# Patient Record
Sex: Female | Born: 1980 | Race: Black or African American | Hispanic: No | Marital: Single | Smoking: Former smoker
Health system: Southern US, Community
[De-identification: ages and names within clinical notes are randomized; demographics above are authoritative.]

## PROBLEM LIST (undated history)

## (undated) DIAGNOSIS — A599 Trichomoniasis, unspecified: Secondary | ICD-10-CM

## (undated) DIAGNOSIS — R519 Headache, unspecified: Secondary | ICD-10-CM

## (undated) DIAGNOSIS — N390 Urinary tract infection, site not specified: Secondary | ICD-10-CM

## (undated) DIAGNOSIS — Z6830 Body mass index (BMI) 30.0-30.9, adult: Secondary | ICD-10-CM

## (undated) DIAGNOSIS — G8929 Other chronic pain: Secondary | ICD-10-CM

## (undated) DIAGNOSIS — F410 Panic disorder [episodic paroxysmal anxiety] without agoraphobia: Secondary | ICD-10-CM

## (undated) DIAGNOSIS — T7840XA Allergy, unspecified, initial encounter: Secondary | ICD-10-CM

## (undated) DIAGNOSIS — M2141 Flat foot [pes planus] (acquired), right foot: Secondary | ICD-10-CM

## (undated) DIAGNOSIS — R42 Dizziness and giddiness: Secondary | ICD-10-CM

## (undated) DIAGNOSIS — M545 Low back pain: Secondary | ICD-10-CM

## (undated) DIAGNOSIS — Z8669 Personal history of other diseases of the nervous system and sense organs: Secondary | ICD-10-CM

## (undated) DIAGNOSIS — R51 Headache: Secondary | ICD-10-CM

## (undated) DIAGNOSIS — G5603 Carpal tunnel syndrome, bilateral upper limbs: Secondary | ICD-10-CM

## (undated) DIAGNOSIS — G629 Polyneuropathy, unspecified: Secondary | ICD-10-CM

## (undated) DIAGNOSIS — M546 Pain in thoracic spine: Secondary | ICD-10-CM

## (undated) HISTORY — DX: Pain in thoracic spine: M54.6

## (undated) HISTORY — DX: Other chronic pain: G89.29

## (undated) HISTORY — DX: Polyneuropathy, unspecified: G62.9

## (undated) HISTORY — DX: Low back pain: M54.5

## (undated) HISTORY — DX: Body mass index (BMI) 30.0-30.9, adult: Z68.30

## (undated) HISTORY — DX: Allergy, unspecified, initial encounter: T78.40XA

## (undated) HISTORY — PX: TOOTH EXTRACTION: SUR596

## (undated) HISTORY — DX: Headache, unspecified: R51.9

## (undated) HISTORY — DX: Personal history of other diseases of the nervous system and sense organs: Z86.69

## (undated) HISTORY — DX: Trichomoniasis, unspecified: A59.9

## (undated) HISTORY — DX: Flat foot (pes planus) (acquired), right foot: M21.41

## (undated) HISTORY — DX: Dizziness and giddiness: R42

## (undated) HISTORY — DX: Panic disorder (episodic paroxysmal anxiety): F41.0

## (undated) HISTORY — PX: NO PAST SURGERIES: SHX2092

---

## 2001-12-05 ENCOUNTER — Other Ambulatory Visit: Admission: RE | Admit: 2001-12-05 | Discharge: 2001-12-05 | Payer: Self-pay | Admitting: Family Medicine

## 2002-06-09 ENCOUNTER — Inpatient Hospital Stay (HOSPITAL_COMMUNITY): Admission: AD | Admit: 2002-06-09 | Discharge: 2002-06-09 | Payer: Self-pay | Admitting: *Deleted

## 2002-06-12 ENCOUNTER — Inpatient Hospital Stay (HOSPITAL_COMMUNITY): Admission: AD | Admit: 2002-06-12 | Discharge: 2002-06-12 | Payer: Self-pay | Admitting: *Deleted

## 2002-06-14 ENCOUNTER — Inpatient Hospital Stay (HOSPITAL_COMMUNITY): Admission: AD | Admit: 2002-06-14 | Discharge: 2002-06-14 | Payer: Self-pay | Admitting: *Deleted

## 2003-03-01 ENCOUNTER — Emergency Department (HOSPITAL_COMMUNITY): Admission: EM | Admit: 2003-03-01 | Discharge: 2003-03-01 | Payer: Self-pay | Admitting: Emergency Medicine

## 2003-11-02 ENCOUNTER — Inpatient Hospital Stay (HOSPITAL_COMMUNITY): Admission: AD | Admit: 2003-11-02 | Discharge: 2003-11-02 | Payer: Self-pay | Admitting: *Deleted

## 2004-01-23 ENCOUNTER — Emergency Department (HOSPITAL_COMMUNITY): Admission: EM | Admit: 2004-01-23 | Discharge: 2004-01-24 | Payer: Self-pay | Admitting: Emergency Medicine

## 2004-04-27 ENCOUNTER — Inpatient Hospital Stay (HOSPITAL_COMMUNITY): Admission: AD | Admit: 2004-04-27 | Discharge: 2004-04-28 | Payer: Self-pay | Admitting: Gynecology

## 2004-05-25 ENCOUNTER — Inpatient Hospital Stay (HOSPITAL_COMMUNITY): Admission: AD | Admit: 2004-05-25 | Discharge: 2004-05-25 | Payer: Self-pay | Admitting: Family Medicine

## 2004-05-26 ENCOUNTER — Inpatient Hospital Stay (HOSPITAL_COMMUNITY): Admission: AD | Admit: 2004-05-26 | Discharge: 2004-05-26 | Payer: Self-pay | Admitting: *Deleted

## 2004-05-29 ENCOUNTER — Inpatient Hospital Stay (HOSPITAL_COMMUNITY): Admission: AD | Admit: 2004-05-29 | Discharge: 2004-05-29 | Payer: Self-pay | Admitting: *Deleted

## 2004-06-02 ENCOUNTER — Encounter: Admission: RE | Admit: 2004-06-02 | Discharge: 2004-06-02 | Payer: Self-pay | Admitting: Obstetrics and Gynecology

## 2004-10-20 ENCOUNTER — Encounter (INDEPENDENT_AMBULATORY_CARE_PROVIDER_SITE_OTHER): Payer: Self-pay | Admitting: *Deleted

## 2004-10-20 ENCOUNTER — Ambulatory Visit: Payer: Self-pay | Admitting: Obstetrics & Gynecology

## 2005-02-22 ENCOUNTER — Inpatient Hospital Stay (HOSPITAL_COMMUNITY): Admission: AD | Admit: 2005-02-22 | Discharge: 2005-02-22 | Payer: Self-pay | Admitting: Obstetrics & Gynecology

## 2005-03-09 ENCOUNTER — Ambulatory Visit: Payer: Self-pay | Admitting: Obstetrics & Gynecology

## 2006-01-07 ENCOUNTER — Inpatient Hospital Stay (HOSPITAL_COMMUNITY): Admission: AD | Admit: 2006-01-07 | Discharge: 2006-01-07 | Payer: Self-pay | Admitting: Obstetrics and Gynecology

## 2006-01-09 ENCOUNTER — Inpatient Hospital Stay (HOSPITAL_COMMUNITY): Admission: AD | Admit: 2006-01-09 | Discharge: 2006-01-09 | Payer: Self-pay | Admitting: Gynecology

## 2006-03-21 ENCOUNTER — Inpatient Hospital Stay (HOSPITAL_COMMUNITY): Admission: AD | Admit: 2006-03-21 | Discharge: 2006-03-21 | Payer: Self-pay | Admitting: Gynecology

## 2006-03-23 ENCOUNTER — Inpatient Hospital Stay (HOSPITAL_COMMUNITY): Admission: AD | Admit: 2006-03-23 | Discharge: 2006-03-23 | Payer: Self-pay | Admitting: Gynecology

## 2006-03-29 ENCOUNTER — Inpatient Hospital Stay (HOSPITAL_COMMUNITY): Admission: AD | Admit: 2006-03-29 | Discharge: 2006-03-29 | Payer: Self-pay | Admitting: Obstetrics and Gynecology

## 2006-06-15 ENCOUNTER — Ambulatory Visit (HOSPITAL_COMMUNITY): Admission: RE | Admit: 2006-06-15 | Discharge: 2006-06-15 | Payer: Self-pay | Admitting: Obstetrics

## 2006-10-15 ENCOUNTER — Inpatient Hospital Stay (HOSPITAL_COMMUNITY): Admission: AD | Admit: 2006-10-15 | Discharge: 2006-10-15 | Payer: Self-pay | Admitting: Obstetrics

## 2006-10-28 ENCOUNTER — Inpatient Hospital Stay (HOSPITAL_COMMUNITY): Admission: AD | Admit: 2006-10-28 | Discharge: 2006-10-28 | Payer: Self-pay | Admitting: Internal Medicine

## 2006-11-06 ENCOUNTER — Inpatient Hospital Stay (HOSPITAL_COMMUNITY): Admission: AD | Admit: 2006-11-06 | Discharge: 2006-11-06 | Payer: Self-pay | Admitting: Obstetrics

## 2006-11-14 ENCOUNTER — Inpatient Hospital Stay (HOSPITAL_COMMUNITY): Admission: AD | Admit: 2006-11-14 | Discharge: 2006-11-17 | Payer: Self-pay | Admitting: Obstetrics

## 2008-02-15 ENCOUNTER — Inpatient Hospital Stay (HOSPITAL_COMMUNITY): Admission: AD | Admit: 2008-02-15 | Discharge: 2008-02-15 | Payer: Self-pay | Admitting: Obstetrics

## 2008-12-06 ENCOUNTER — Inpatient Hospital Stay (HOSPITAL_COMMUNITY): Admission: AD | Admit: 2008-12-06 | Discharge: 2008-12-06 | Payer: Self-pay | Admitting: Obstetrics and Gynecology

## 2009-04-29 ENCOUNTER — Emergency Department (HOSPITAL_COMMUNITY): Admission: EM | Admit: 2009-04-29 | Discharge: 2009-04-29 | Payer: Self-pay | Admitting: Emergency Medicine

## 2011-01-14 LAB — POCT PREGNANCY, URINE: Preg Test, Ur: NEGATIVE

## 2011-02-19 NOTE — Group Therapy Note (Signed)
NAMETALANA, SLATTEN NO.:  [id number]   MEDICAL RECORD NO.:  [id number]          PATIENT TYPE:  WOC   LOCATION:  [facility]                   FACILITY:  [facility]   PHYSICIAN:  Elsie Lincoln, MD      DATE OF BIRTH:  1981-05-11   DATE OF SERVICE:  10/20/2004                                    CLINIC NOTE   Patient is a 30 year old nulliparous female who complains of irregular  menses.  Usually she has one period a month; however, last month she had 2  periods.  She did take a pregnancy test which was negative.  When queried  about how many irregular periods she has a year it is approximately 1.  Patient was reassured that this can be normal and that further workup is not  needed at this time.  She is complaining of burning after sex, so we will do  cultures today.  She has also a history of Bartholin's cyst that has been  resolved.   REVIEW OF SYMPTOMS:  Positive for vaginal discharge, no other complaints.   PHYSICAL EXAM:  Temperature 97.1, pulse 85, blood pressure 105/66, weight  92.3, height 5 feet 1 inch.  ABDOMEN:  Soft, nontender, nondistended.  GENITALIA:  Tanner V.  VAGINA:  A small amount of discharge present.  Cervix closed, nontender.  UTERUS:  Small, nontender.  ADNEXA:  Nontender, no masses.   ASSESSMENT AND PLAN:  A 30 year old female with one irregular menses this  past year.  Patient reassured.  1.  Pap smear and culture sent.  2.  EPT done.  3.  Return to clinic in 1 year or p.r.n. complaints.      KL/MEDQ  D:  11/16/2004  T:  11/16/2004  Job:  [id number]

## 2011-02-19 NOTE — Group Therapy Note (Signed)
NAME:  Rachel Finley, Rachel Finley NO.:  [id number]   MEDICAL RECORD NO.:  [id number]                   PATIENT TYPE:  OUT   LOCATION:  [facility]                           FACILITY:  [facility]   PHYSICIAN:  Argentina Donovan, MD                     DATE OF BIRTH:  10/08/1980   DATE OF SERVICE:  06/02/2004                                    CLINIC NOTE   HISTORY:  This is a 30 year old nulliparous female who comes by appointment  for a follow-up of Bartholin's cyst.  She states that on May 26, 2004 she  was seen in maternity admissions for an inflamed Bartholin's cyst which was  painful, and at that point she had a tube placed.  She states that this cyst  had been present about 2 years ago but was not causing her any problem  during all that time.  After having sex, she began having pain and states  that she came back to the MAU on August 27 - 5 days later - at which point  they took the tube out as it was getting better.  She does state that it is  less than half the size and not very painful at present.  She is able to  have intercourse without pain.  There has been no purulent drainage.  Her  second concern is with infertility.  She states that with her current  partner, her fiance, she has been trying to get pregnant for a year-and-a-  half.  This person has succeeded in making someone else pregnant.  Previous  to that, she was with the same partner for 3 years, attempting pregnancy,  and that partner also was proven to be fertile.  She states that her periods  are fairly regular with LMP May 25, 2004.  She has about 5 days flow with  medium amount and mild dysmenorrhea at times.  She has no intermenstrual  bleeding.  She has not used any birth control methods in the past.  She  states that her last Pap smear was normal last year and that she has never  had an abnormal Pap, is not due yet for a repeat.   SURGICAL HISTORY:  Noncontributory.   FAMILY HISTORY:   Noncontributory.   PAST MEDICAL HISTORY:  Noncontributory.   SOCIAL HISTORY:  She does work.  She is negative for tobacco, alcohol, or  drugs.  Also negative for sexual or physical abuse.   REVIEW OF SYSTEMS:  Significant for frequent tension-type headaches and she  has had Trichomonas in the past.  Of note, last weekend in MAU she was  treated with Rocephin 1 g IM and was also given Vicodin and Motrin.  Of  note, then her urinalysis was negative.  Also of note, when she was seen  April 27, 2004 in MAU she did have a Trichomonas and a yeast infection.  She  was treated that day for the yeast infection, also with Vantin and  Zithromax, as well as Septra for a presumptive UTI though there was not a  positive urinalysis from that visit.  In January 2005 she was also treated  with Flagyl for Trichomonas vaginitis and was also treated with Diflucan as  well as Rocephin.  However, at that visit she had many yeast, many  Trichomonas, negative GC, negative chlamydia.  Urinalysis was negative  except for 30 of protein at that visit; that was in January 2005.  On  further chart review, it is noted that she did have a positive GC test done  September 2003.  This was treated with Cipro at that time.   PHYSICAL EXAMINATION:  GENERAL:  This is an underweight, 90-pound, 5 feet 1  inch female in no distress.  BREASTS:  Without masses.  ABDOMEN:  Soft, flat, nontender, without obvious masses.  PELVIC:  External genitalia significant for a 2 cm mildly indurated,  slightly tender Bartholin's cyst on the left, no drainage, and diminished in  size from last visit last week.   ASSESSMENT:  1.  Bartholin's cyst, resolving.  2.  Infertility.   PLAN:  Discussed timing for intercourse and she will start using a basal  body thermometer for the next 3 months, and can return for further  evaluation at that time if desired.  She was also advised to take a vitamin  and eat foods high in folic acid.      Sharolyn Douglas, CNM                           Argentina Donovan, MD    DP/MEDQ  D:  06/02/2004  T:  06/03/2004  Job:  [id number]

## 2012-08-23 ENCOUNTER — Encounter (HOSPITAL_COMMUNITY): Payer: Self-pay | Admitting: *Deleted

## 2012-08-23 ENCOUNTER — Inpatient Hospital Stay (HOSPITAL_COMMUNITY)
Admission: AD | Admit: 2012-08-23 | Discharge: 2012-08-23 | Disposition: A | Discharge status: Home / Self Care | Payer: BC Managed Care – PPO | Source: Ambulatory Visit | Attending: Obstetrics and Gynecology | Admitting: Obstetrics and Gynecology

## 2012-08-23 DIAGNOSIS — R1032 Left lower quadrant pain: Secondary | ICD-10-CM | POA: Insufficient documentation

## 2012-08-23 HISTORY — DX: Urinary tract infection, site not specified: N39.0

## 2012-08-23 HISTORY — DX: Headache: R51

## 2012-08-23 HISTORY — DX: Carpal tunnel syndrome, bilateral upper limbs: G56.03

## 2012-08-23 LAB — WET PREP, GENITAL
Trich, Wet Prep: NONE SEEN
Yeast Wet Prep HPF POC: NONE SEEN

## 2012-08-23 MED ORDER — CONCEPT OB 130-92.4-1 MG PO CAPS: 1.0000 | ORAL_CAPSULE | ORAL | Status: DC

## 2012-08-23 MED ORDER — DOXYCYCLINE HYCLATE 100 MG PO CAPS: 100.0000 mg | ORAL_CAPSULE | Freq: Two times a day (BID) | ORAL | Status: DC

## 2012-08-23 NOTE — MAU Note (Signed)
Was in the office on Fri, the 8th.  Had Mirena taken out, is trying to get preg.  Lower abd pain started a couple days later, than had cycle, past couple days pain has returned and been nauseated.

## 2012-08-23 NOTE — MAU Provider Note (Signed)
CC: Abdominal Pain    First Provider Initiated Contact with Patient 08/23/12 1827      HPI AMAIYAH NORDHOFF is a 31 y.o. G1P1001 who presents with history of intermittent crampy lower abdominal pain that began with onset of menses 2 days after IUD was removed 08/11/2012. Cramps are similar to that she had with menses prior to the IUD. The cramps subsided toward the end of her menstrual period, but have recurred especially in left lower quadrant and particularly when she bends over. The discomfort usually lasts about 5 minutes. She denies cramping at present but has a dull discomfort in left lower quadrant. Had normal  BM today which did not affect pain. Denies constipation, diarrhea, vomiting. Vaginal discharge was irritative last week but resolved, no malodor. No new sex partner.  She has been slightly nauseated today. Denies abnormal bleeding.  She is also concerned she could be pregnant since she was sexually active around the time of IUD removal and is attempting pregnancy. LMP 08/13/12.   Past Medical History  Diagnosis Date  . Headache   . Urinary tract infection   . Carpal tunnel syndrome, bilateral     OB History    Grav Para Term Preterm Abortions TAB SAB Ect Mult Living   1 1 1  0 0 0 0 0 0 1     # Outc Date GA Lbr Len/2nd Wgt Sex Del Anes PTL Lv   1 TRM     F SVD EPI No Yes      Past Surgical History  Procedure Date  . No past surgeries     History   Social History  . Marital Status: Single    Spouse Name: N/A    Number of Children: N/A  . Years of Education: N/A   Occupational History  . Not on file.   Social History Main Topics  . Smoking status: Former Games developer  . Smokeless tobacco: Never Used     Comment: age 22  . Alcohol Use: Yes     Comment: socially  . Drug Use: No  . Sexually Active: Yes    Birth Control/ Protection: None   Other Topics Concern  . Not on file   Social History Narrative  . No narrative on file    No current  facility-administered medications on file prior to encounter.   No current outpatient prescriptions on file prior to encounter.    Allergies  Allergen Reactions  . Codeine Rash    Tylenol codeine    ROS Pertinent items in HPI  PHYSICAL EXAM General: Well nourished, well developed female in no acute distress Cardiovascular: Normal rate Respiratory: Normal effort Abdomen: Soft, mildly tender left suprapubic region. No guarding or rebound. Back: No CVAT Extremities: No edema Neurologic: Alert and oriented  Speculum exam: NEFG; vagina with physiologic discharge, no blood; cervix clean Bimanual exam: cervix closed, no CMT; uterus NSSP; slight left adnexal tenderness and fullness  LAB RESULTS Results for orders placed during the hospital encounter of 08/23/12 (from the past 24 hour(s))  POCT PREGNANCY, URINE     Status: Normal   Collection Time   08/23/12  6:20 PM      Component Value Range   Preg Test, Ur NEGATIVE  NEGATIVE  WET PREP, GENITAL     Status: Abnormal   Collection Time   08/23/12  6:40 PM      Component Value Range   Yeast Wet Prep HPF POC NONE SEEN  NONE SEEN   Trich,  Wet Prep NONE SEEN  NONE SEEN   Clue Cells Wet Prep HPF POC FEW (*) NONE SEEN   WBC, Wet Prep HPF POC FEW (*) NONE SEEN     MAU COURSE Declines analgesic. Declines STI testing.  ASSESSMENT 1. Abdominal pain, left lower quadrant     PLAN D/W Dr. Ambrose Mantle Discharge home with Rx Doxyclcline. See AVS for patient education.   Medication List     As of 08/23/2012  7:21 PM    TAKE these medications         CONCEPT OB 130-92.4-1 MG Caps   Take 1 tablet by mouth 1 day or 1 dose.      doxycycline 100 MG capsule   Commonly known as: VIBRAMYCIN   Take 1 capsule (100 mg total) by mouth 2 (two) times daily.      ibuprofen 600 MG tablet   Commonly known as: ADVIL,MOTRIN   Take 600 mg by mouth every 6 (six) hours as needed. headache           Danae Orleans, CNM 08/23/2012 6:44  PM

## 2012-11-24 ENCOUNTER — Ambulatory Visit (INDEPENDENT_AMBULATORY_CARE_PROVIDER_SITE_OTHER): Payer: BC Managed Care – PPO | Admitting: Family Medicine

## 2012-11-24 VITALS — BP 98/70 | HR 80 | Temp 98.2°F | Resp 18 | Ht 61.0 in | Wt 127.0 lb

## 2012-11-24 DIAGNOSIS — R05 Cough: Secondary | ICD-10-CM

## 2012-11-24 MED ORDER — IPRATROPIUM BROMIDE 0.06 % NA SOLN: 2.0000 | Freq: Four times a day (QID) | NASAL | Status: DC

## 2012-11-24 MED ORDER — PROMETHAZINE-DM 6.25-15 MG/5ML PO SYRP: 5.0000 mL | ORAL_SOLUTION | Freq: Every evening | ORAL | Status: DC | PRN

## 2012-11-24 NOTE — Patient Instructions (Signed)
Continue claritin, add atrovent and saline sprays as discussed.  mucinex dm during day for cough, sough syrup at bedtime if needed. Return to the clinic or go to the nearest emergency room if any of your symptoms worsen or new symptoms occur.    Upper Respiratory Infection, Adult An upper respiratory infection (URI) is also known as the common cold. It is often caused by a type of germ (virus). Colds are easily spread (contagious). You can pass it to others by kissing, coughing, sneezing, or drinking out of the same glass. Usually, you get better in 1 or 2 weeks.  HOME CARE   Only take medicine as told by your doctor.  Use a warm mist humidifier or breathe in steam from a hot shower.  Drink enough water and fluids to keep your pee (urine) clear or pale yellow.  Get plenty of rest.  Return to work when your temperature is back to normal or as told by your doctor. You may use a face mask and wash your hands to stop your cold from spreading. GET HELP RIGHT AWAY IF:   After the first few days, you feel you are getting worse.  You have questions about your medicine.  You have chills, shortness of breath, or brown or red spit (mucus).  You have yellow or brown snot (nasal discharge) or pain in the face, especially when you bend forward.  You have a fever, puffy (swollen) neck, pain when you swallow, or white spots in the back of your throat.  You have a bad headache, ear pain, sinus pain, or chest pain.  You have a high-pitched whistling sound when you breathe in and out (wheezing).  You have a lasting cough or cough up blood.  You have sore muscles or a stiff neck. MAKE SURE YOU:   Understand these instructions.  Will watch your condition.  Will get help right away if you are not doing well or get worse. Document Released: 03/08/2008 Document Revised: 12/13/2011 Document Reviewed: 01/25/2011 [facility] Patient Information 2013 [place], [place].

## 2012-11-24 NOTE — Progress Notes (Signed)
Subjective:    Patient ID: Rachel Finley, female    DOB: [birth year], 32 y.o.   MRN: [id number]  HPI Rachel Finley is a 32 y.o. female  "feels horrible" started 4 days ago with sinus pressure, then itchy throat, then eyes watering.  Cough with lying down at night and PND, repetitive sneezing. Watery eyes and congestion worst sx today.  No fevers.  No new bodyaches.   Tx: generic claritin - 1 per day.  Benadryl - helps to sleep.   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  Has 41 yo daughter at home.   Review of Systems  Constitutional: Negative for fever and chills.  HENT: Positive for congestion, rhinorrhea, sneezing and postnasal drip. Negative for sore throat, mouth sores and trouble swallowing.   Respiratory: Positive for cough. Negative for shortness of breath.        Objective:   Physical Exam  Vitals reviewed. Constitutional: She is oriented to person, place, and time. She appears well-developed and well-nourished. No distress.  HENT:  Head: Normocephalic and atraumatic.  Right Ear: Hearing, tympanic membrane, external ear and ear canal normal.  Left Ear: Hearing, tympanic membrane, external ear and ear canal normal.  Nose: Nose normal. Right sinus exhibits no maxillary sinus tenderness and no frontal sinus tenderness. Left sinus exhibits no maxillary sinus tenderness and no frontal sinus tenderness.  Mouth/Throat: Oropharynx is clear and moist. No oropharyngeal exudate.  Eyes: Conjunctivae and EOM are normal. Pupils are equal, round, and reactive to light.  Cardiovascular: Normal rate, regular rhythm, normal heart sounds and intact distal pulses.   No murmur heard. Pulmonary/Chest: Effort normal and breath sounds normal. No respiratory distress. She has no wheezes. She has no rhonchi.  Lymphadenopathy:    She has no cervical adenopathy.  Neurological: She is alert and oriented to person, place, and time.  Skin: Skin is warm and dry. No rash noted.  Psychiatric:  She has a normal mood and affect. Her behavior is normal.       Assessment & Plan:  Rachel Finley is a 32 y.o. female Acute upper respiratory infections of unspecified site - Plan: promethazine-dextromethorphan (PROMETHAZINE-DM) 6.25-15 MG/5ML syrup, ipratropium (ATROVENT) 0.06 % nasal spray  Cough - Plan: promethazine-dextromethorphan (PROMETHAZINE-DM) 6.25-15 MG/5ML syrup, ipratropium (ATROVENT) 0.06 % nasal spray  Uri, with nocturnal cough with insomnia. Sx care as above, sed of phenergan dm, rtc precautions.  Patient Instructions  Continue claritin, add atrovent and saline sprays as discussed.  mucinex dm during day for cough, sough syrup at bedtime if needed. Return to the clinic or go to the nearest emergency room if any of your symptoms worsen or new symptoms occur.    Upper Respiratory Infection, Adult An upper respiratory infection (URI) is also known as the common cold. It is often caused by a type of germ (virus). Colds are easily spread (contagious). You can pass it to others by kissing, coughing, sneezing, or drinking out of the same glass. Usually, you get better in 1 or 2 weeks.  HOME CARE   Only take medicine as told by your doctor.  Use a warm mist humidifier or breathe in steam from a hot shower.  Drink enough water and fluids to keep your pee (urine) clear or pale yellow.  Get plenty of rest.  Return to work when your temperature is back to normal or as told by your doctor. You may use a face mask and wash your hands to stop your cold from  spreading. GET HELP RIGHT AWAY IF:   After the first few days, you feel you are getting worse.  You have questions about your medicine.  You have chills, shortness of breath, or brown or red spit (mucus).  You have yellow or brown snot (nasal discharge) or pain in the face, especially when you bend forward.  You have a fever, puffy (swollen) neck, pain when you swallow, or white spots in the back of your throat.  You  have a bad headache, ear pain, sinus pain, or chest pain.  You have a high-pitched whistling sound when you breathe in and out (wheezing).  You have a lasting cough or cough up blood.  You have sore muscles or a stiff neck. MAKE SURE YOU:   Understand these instructions.  Will watch your condition.  Will get help right away if you are not doing well or get worse. Document Released: 03/08/2008 Document Revised: 12/13/2011 Document Reviewed: 01/25/2011 [facility] Patient Information 2013 [place], [place].

## 2013-04-28 ENCOUNTER — Encounter (HOSPITAL_COMMUNITY): Payer: Self-pay | Admitting: Emergency Medicine

## 2013-04-28 ENCOUNTER — Emergency Department (HOSPITAL_COMMUNITY)
Admission: EM | Admit: 2013-04-28 | Discharge: 2013-04-28 | Disposition: A | Discharge status: Home / Self Care | Payer: BC Managed Care – PPO | Attending: Emergency Medicine | Admitting: Emergency Medicine

## 2013-04-28 ENCOUNTER — Telehealth (HOSPITAL_COMMUNITY): Payer: Self-pay | Admitting: *Deleted

## 2013-04-28 DIAGNOSIS — M549 Dorsalgia, unspecified: Secondary | ICD-10-CM

## 2013-04-28 DIAGNOSIS — Z9109 Other allergy status, other than to drugs and biological substances: Secondary | ICD-10-CM | POA: Insufficient documentation

## 2013-04-28 DIAGNOSIS — M545 Low back pain, unspecified: Secondary | ICD-10-CM | POA: Insufficient documentation

## 2013-04-28 DIAGNOSIS — Z87440 Personal history of urinary (tract) infections: Secondary | ICD-10-CM | POA: Insufficient documentation

## 2013-04-28 DIAGNOSIS — R350 Frequency of micturition: Secondary | ICD-10-CM | POA: Insufficient documentation

## 2013-04-28 DIAGNOSIS — Z87891 Personal history of nicotine dependence: Secondary | ICD-10-CM | POA: Insufficient documentation

## 2013-04-28 DIAGNOSIS — Z3202 Encounter for pregnancy test, result negative: Secondary | ICD-10-CM | POA: Insufficient documentation

## 2013-04-28 DIAGNOSIS — Z8739 Personal history of other diseases of the musculoskeletal system and connective tissue: Secondary | ICD-10-CM | POA: Insufficient documentation

## 2013-04-28 DIAGNOSIS — Z79899 Other long term (current) drug therapy: Secondary | ICD-10-CM | POA: Insufficient documentation

## 2013-04-28 LAB — POCT PREGNANCY, URINE: Preg Test, Ur: NEGATIVE

## 2013-04-28 LAB — URINALYSIS, ROUTINE W REFLEX MICROSCOPIC
Bilirubin Urine: NEGATIVE
Ketones, ur: NEGATIVE mg/dL
Leukocytes, UA: NEGATIVE
Nitrite: NEGATIVE
Urobilinogen, UA: 0.2 mg/dL (ref 0.0–1.0)

## 2013-04-28 LAB — URINE MICROSCOPIC-ADD ON

## 2013-04-28 MED ORDER — ACETAMINOPHEN 500 MG PO TABS
1000.0000 mg | ORAL_TABLET | Freq: Once | ORAL | Status: AC
  Administered 2013-04-28: 1000 mg via ORAL
  Filled 2013-04-28: qty 2

## 2013-04-28 MED ORDER — TRAMADOL HCL 50 MG PO TABS: 50.0000 mg | ORAL_TABLET | Freq: Four times a day (QID) | ORAL | Status: DC | PRN

## 2013-04-28 NOTE — ED Notes (Signed)
Patient reports that she woke up about a week ago and her back was bothering her. The patient denies any injury or noted trauma. The patient denies any further complaints

## 2013-04-28 NOTE — ED Provider Notes (Signed)
CSN: [id number]     Arrival date & time 04/28/13  1759 History  This chart was scribed for Rachel Silk, PA-C working with Suzi Roots, MD by Greggory Stallion, ED scribe. This patient was seen in room WTR7/WTR7 and the patient's care was started at 6:07 PM.   Chief Complaint  Patient presents with  . Back Pain   The history is provided by the patient. No language interpreter was used.    HPI Comments: Rachel Finley is a 32 y.o. female who presents to the Emergency Department complaining of gradual onset, constant achy lower back pain that started one week ago. She states she was laying down when the pain started. Pt states the pain is worsened when she turns. She states if she lays on either of her sides, they go numb. Pt states she has taken generic Advil with no relief. She states she has to urinate more frequently that normal. Pt denies fever, rash, dysuria, urinary urgency, urinary incontinence and bowel incontinence as associated symptoms.   Past Medical History  Diagnosis Date  . Headache   . Urinary tract infection   . Carpal tunnel syndrome, bilateral   . Allergy    Past Surgical History  Procedure Laterality Date  . No past surgeries     Family History  Problem Relation Age of Onset  . Other Neg Hx   . Hypertension Father   . Asthma Daughter    History  Substance Use Topics  . Smoking status: Former Games developer  . Smokeless tobacco: Never Used     Comment: age 62  . Alcohol Use: Yes     Comment: socially   OB History   Grav Para Term Preterm Abortions TAB SAB Ect Mult Living   1 1 1  0 0 0 0 0 0 1     Review of Systems  Constitutional: Negative for fever.  Genitourinary: Positive for frequency. Negative for dysuria and urgency.  Musculoskeletal: Positive for back pain.  Skin: Negative for rash.  All other systems reviewed and are negative.    Allergies  Codeine  Home Medications   Current Outpatient Rx  Name  Route  Sig  Dispense  Refill  .  ipratropium (ATROVENT) 0.06 % nasal spray   Nasal   Place 2 sprays into the nose 4 (four) times daily.   15 mL   1   . Prenat w/o A Vit-FeFum-FePo-FA (CONCEPT OB) 130-92.4-1 MG CAPS   Oral   Take 1 tablet by mouth 1 day or 1 dose.   30 capsule   5   . promethazine-dextromethorphan (PROMETHAZINE-DM) 6.25-15 MG/5ML syrup   Oral   Take 5 mLs by mouth at bedtime as needed for cough.   118 mL   0    BP 121/66  Pulse 85  Temp(Src) 98.9 F (37.2 C) (Oral)  Resp 18  SpO2 99%  Physical Exam  Nursing note and vitals reviewed. Constitutional: She is oriented to person, place, and time. She appears well-developed and well-nourished. No distress.  HENT:  Head: Normocephalic and atraumatic.  Right Ear: External ear normal.  Left Ear: External ear normal.  Nose: Nose normal.  Mouth/Throat: Oropharynx is clear and moist.  Eyes: Conjunctivae are normal.  Neck: Normal range of motion.  Cardiovascular: Normal rate, regular rhythm and normal heart sounds.   Pulmonary/Chest: Effort normal and breath sounds normal. No stridor. No respiratory distress. She has no wheezes. She has no rales.  Abdominal: Soft. She exhibits no distension.  Musculoskeletal: Normal range of motion.  Tenderness to palpation in right lower back. No bony tenderness. Pt walks without antalgic gait. Strength 5/5 in all extremities.   Neurological: She is alert and oriented to person, place, and time. She has normal strength.  Skin: Skin is warm and dry. She is not diaphoretic. No erythema.  Psychiatric: She has a normal mood and affect. Her behavior is normal.    ED Course   Procedures (including critical care time)  DIAGNOSTIC STUDIES: Oxygen Saturation is 99% on RA, normal by my interpretation.    COORDINATION OF CARE: 6:26 PM-Discussed treatment plan which includes UA and Tylenol for pain with pt at bedside and pt agreed to plan.   Labs Reviewed  URINALYSIS, ROUTINE W REFLEX MICROSCOPIC - Abnormal; Notable  for the following:    APPearance CLOUDY (*)    Specific Gravity, Urine 1.031 (*)    Hgb urine dipstick SMALL (*)    All other components within normal limits  URINE MICROSCOPIC-ADD ON - Abnormal; Notable for the following:    Squamous Epithelial / LPF FEW (*)    Bacteria, UA FEW (*)    All other components within normal limits  POCT PREGNANCY, URINE   No results found. 1. Back pain     MDM  Patient with back pain.  No neurological deficits and normal neuro exam.  Patient can walk but states is painful.  No loss of bowel or bladder control.  No concern for cauda equina.  No fever, night sweats, weight loss, h/o cancer, IVDU.  RICE protocol and pain medicine indicated and discussed with patient.      I personally performed the services described in this documentation, which was scribed in my presence. The recorded information has been reviewed and is accurate.    Mora Bellman, PA-C 04/28/13 2018

## 2013-05-01 NOTE — ED Provider Notes (Signed)
Medical screening examination/treatment/procedure(s) were performed by non-physician practitioner and as supervising physician I was immediately available for consultation/collaboration.   Bodhi Moradi E Tameeka Luo, MD 05/01/13 0934 

## 2013-08-09 ENCOUNTER — Ambulatory Visit (INDEPENDENT_AMBULATORY_CARE_PROVIDER_SITE_OTHER): Payer: BC Managed Care – PPO | Admitting: Podiatry

## 2013-08-09 ENCOUNTER — Ambulatory Visit (INDEPENDENT_AMBULATORY_CARE_PROVIDER_SITE_OTHER): Payer: BC Managed Care – PPO

## 2013-08-09 ENCOUNTER — Encounter: Payer: Self-pay | Admitting: Podiatry

## 2013-08-09 VITALS — BP 104/63 | HR 69 | Resp 16 | Ht 61.0 in | Wt 125.0 lb

## 2013-08-09 DIAGNOSIS — M21611 Bunion of right foot: Secondary | ICD-10-CM

## 2013-08-09 DIAGNOSIS — M2010 Hallux valgus (acquired), unspecified foot: Secondary | ICD-10-CM

## 2013-08-09 DIAGNOSIS — M21619 Bunion of unspecified foot: Secondary | ICD-10-CM

## 2013-08-09 NOTE — Progress Notes (Signed)
Subjective:     Patient ID: Rachel Finley, female   DOB: October 30, 1980, 32 y.o.   MRN: [id number]  HPI patient states my bunions are starting to bother me more right over left. States that she is tried to change shoe gear soaks and anti-inflammatories without relief of symptoms and they have shooting pains in them at different intervals   Review of Systems  All other systems reviewed and are negative.       Objective:   Physical Exam  Nursing note and vitals reviewed. Constitutional: She is oriented to person, place, and time.  Cardiovascular: Intact distal pulses.   Musculoskeletal: Normal range of motion.  Neurological: She is oriented to person, place, and time.  Skin: Skin is warm.   patient has hyperostosis medial aspect first metatarsal head right over left with redness and pain when pressed. There is also mild deviation of the hallux with no hammertoe deformity noted     Assessment:     HAV deformity right over left foot    Plan:     H&P and x-ray reviewed. Discussed different treatment options and patient would like to have the bunion corrected. I've recommended one at a time which can be done over Christmas and she'll reappoint middle of December to discuss the surgery consisting of Rachel Finley which I educated her on today

## 2013-08-09 NOTE — Progress Notes (Signed)
  Subjective:    Patient ID: Rachel Finley, female    DOB: Aug 19, 1981, 32 y.o.   MRN: [id number]  HPI Comments: N - aches L - 1st met bilateral (R>L) D - years O - gradual  C - bunions, getting more painful, some swelling and redness A - shoes, walking T - open shoes, massages      Review of Systems  All other systems reviewed and are negative.       Objective:   Physical Exam        Assessment & Plan:

## 2013-09-13 ENCOUNTER — Ambulatory Visit: Payer: BC Managed Care – PPO | Admitting: Podiatry

## 2013-09-20 ENCOUNTER — Ambulatory Visit (INDEPENDENT_AMBULATORY_CARE_PROVIDER_SITE_OTHER): Payer: BC Managed Care – PPO | Admitting: Podiatry

## 2013-09-20 ENCOUNTER — Encounter: Payer: Self-pay | Admitting: Podiatry

## 2013-09-20 VITALS — BP 108/70 | HR 69 | Resp 16

## 2013-09-20 DIAGNOSIS — M2010 Hallux valgus (acquired), unspecified foot: Secondary | ICD-10-CM

## 2013-09-20 NOTE — Patient Instructions (Signed)
Pre-Operative Instructions  Congratulations, you have decided to take an important step to improving your quality of life.  You can be assured that the doctors of [facility] will be with you every step of the way.  1. Plan to be at the surgery center/hospital at least 1 (one) hour prior to your scheduled time unless otherwise directed by the surgical center/hospital staff.  You must have a responsible adult accompany you, remain during the surgery and drive you home.  Make sure you have directions to the surgical center/hospital and know how to get there on time. 2. For hospital based surgery you will need to obtain a history and physical form from your family physician within 1 month prior to the date of surgery- we will give you a form for you primary physician.  3. We make every effort to accommodate the date you request for surgery.  There are however, times where surgery dates or times have to be moved.  We will contact you as soon as possible if a change in schedule is required.   4. No Aspirin/Ibuprofen for one week before surgery.  If you are on aspirin, any non-steroidal anti-inflammatory medications (Mobic, Aleve, Ibuprofen) you should stop taking it 7 days prior to your surgery.  You make take Tylenol  For pain prior to surgery.  5. Medications- If you are taking daily heart and blood pressure medications, seizure, reflux, allergy, asthma, anxiety, pain or diabetes medications, make sure the surgery center/hospital is aware before the day of surgery so they may notify you which medications to take or avoid the day of surgery. 6. No food or drink after midnight the night before surgery unless directed otherwise by surgical center/hospital staff. 7. No alcoholic beverages 24 hours prior to surgery.  No smoking 24 hours prior to or 24 hours after surgery. 8. Wear loose pants or shorts- loose enough to fit over bandages, boots, and casts. 9. No slip on shoes, sneakers are best. 10. Bring  your boot with you to the surgery center/hospital.  Also bring crutches or a walker if your physician has prescribed it for you.  If you do not have this equipment, it will be provided for you after surgery. 11. If you have not been contracted by the surgery center/hospital by the day before your surgery, call to confirm the date and time of your surgery. 12. Leave-time from work may vary depending on the type of surgery you have.  Appropriate arrangements should be made prior to surgery with your employer. 13. Prescriptions will be provided immediately following surgery by your doctor.  Have these filled as soon as possible after surgery and take the medication as directed. 14. Remove nail polish on the operative foot. 15. Wash the night before surgery.  The night before surgery wash the foot and leg well with the antibacterial soap provided and water paying special attention to beneath the toenails and in between the toes.  Rinse thoroughly with water and dry well with a towel.  Perform this wash unless told not to do so by your physician.  Enclosed: 1 Ice pack (please put in freezer the night before surgery)   1 Hibiclens skin cleaner   Pre-op Instructions  If you have any questions regarding the instructions, do not hesitate to call our office.  [place]: [address] [place], [place] [address] [phone]  [place]: [address] [place], [place] [address] [phone]  [place]: [address]  [place], [place] [address] [phone]  Dr. Richard   Tuchman DPM, Dr. Norman Regal DPM Dr. Richard Sikora DPM, Dr. M. Todd Hyatt DPM, Dr. Kathryn Egerton DPM 

## 2013-09-20 NOTE — Progress Notes (Signed)
Subjective:     Patient ID: Rachel Finley, female   DOB: [birth year], 32 y.o.   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 I have recommended [place] bunionectomy and I allowed patient to read consent formline by line concerning correction. Explaining all possible complications and the total recovery Take 6 months to one year for this type of surgery. Patient wants surgery signs consent form and  dispense air fracture walker with all instructions on usage today and was given all preoperative instructions

## 2013-09-24 ENCOUNTER — Encounter: Payer: BC Managed Care – PPO | Admitting: Podiatry

## 2013-10-01 ENCOUNTER — Encounter: Payer: BC Managed Care – PPO | Admitting: Podiatry

## 2014-08-05 ENCOUNTER — Encounter: Payer: Self-pay | Admitting: Podiatry

## 2014-11-07 ENCOUNTER — Ambulatory Visit: Payer: BLUE CROSS/BLUE SHIELD

## 2014-11-28 ENCOUNTER — Ambulatory Visit: Payer: BLUE CROSS/BLUE SHIELD | Admitting: Podiatry

## 2014-12-03 DIAGNOSIS — A5901 Trichomonal vulvovaginitis: Secondary | ICD-10-CM

## 2014-12-03 HISTORY — DX: Trichomonal vulvovaginitis: A59.01

## 2015-03-28 ENCOUNTER — Encounter (HOSPITAL_COMMUNITY): Payer: Self-pay | Admitting: *Deleted

## 2015-03-28 ENCOUNTER — Inpatient Hospital Stay (HOSPITAL_COMMUNITY)
Admission: AD | Admit: 2015-03-28 | Discharge: 2015-03-28 | Disposition: A | Discharge status: Home / Self Care | Payer: BLUE CROSS/BLUE SHIELD | Source: Ambulatory Visit | Attending: Obstetrics and Gynecology | Admitting: Obstetrics and Gynecology

## 2015-03-28 DIAGNOSIS — Z87891 Personal history of nicotine dependence: Secondary | ICD-10-CM | POA: Insufficient documentation

## 2015-03-28 DIAGNOSIS — R102 Pelvic and perineal pain: Secondary | ICD-10-CM | POA: Diagnosis present

## 2015-03-28 DIAGNOSIS — N764 Abscess of vulva: Secondary | ICD-10-CM | POA: Diagnosis not present

## 2015-03-28 HISTORY — DX: Trichomoniasis, unspecified: A59.9

## 2015-03-28 LAB — URINE MICROSCOPIC-ADD ON

## 2015-03-28 LAB — URINALYSIS, ROUTINE W REFLEX MICROSCOPIC
Bilirubin Urine: NEGATIVE
Glucose, UA: NEGATIVE mg/dL
KETONES UR: NEGATIVE mg/dL
LEUKOCYTES UA: NEGATIVE
NITRITE: NEGATIVE
PH: 5.5 (ref 5.0–8.0)
Protein, ur: NEGATIVE mg/dL
SPECIFIC GRAVITY, URINE: 1.015 (ref 1.005–1.030)
UROBILINOGEN UA: 0.2 mg/dL (ref 0.0–1.0)

## 2015-03-28 LAB — POCT PREGNANCY, URINE: Preg Test, Ur: NEGATIVE

## 2015-03-28 NOTE — MAU Provider Note (Signed)
History     CSN: [id number]  Arrival date and time: 03/28/15 2056   None     Chief Complaint  Patient presents with  . Vaginal Pain   HPI This is a 34 y.o. female who presents with c/o swollen area on left labia which is somewhat painful. States it started hurting this morning. Decided to wait it out. Then tonight she looked at the area and noted that it was swollen and saw a bump there.  States has had a Bartholins abscess before (never drained, resolved on its own) but this feels like it is a different and in a different place   Also complains of some urinary frequency but no dysuria or other symptoms. States does not want STD testing, was recently treated for Trichomonas last week.   RN Note:    Expand All Collapse All   Awoke this morning with vaginal pain. Looked at vaginal area tonight and L side is swollen and see a bump there. Urinating more but no pain.           OB History    Gravida Para Term Preterm AB TAB SAB Ectopic Multiple Living   1 1 1  0 0 0 0 0 0 1      Past Medical History  Diagnosis Date  . Headache(784.0)   . Urinary tract infection   . Carpal tunnel syndrome, bilateral   . Allergy     Past Surgical History  Procedure Laterality Date  . No past surgeries    . Tooth extraction      scheduled for Monday Dec 22nd 2014    Family History  Problem Relation Age of Onset  . Other Neg Hx   . Hypertension Father   . Asthma Daughter     History  Substance Use Topics  . Smoking status: Former 11-14-1972  . Smokeless tobacco: Never Used     Comment: age 16  . Alcohol Use: Yes     Comment: socially    Allergies:  Allergies  Allergen Reactions  . Codeine Rash    Tylenol codeine    Prescriptions prior to admission  Medication Sig Dispense Refill Last Dose  . levonorgestrel (MIRENA) 20 MCG/24HR IUD 1 each by Intrauterine route once.   OCT 2013  . traMADol (ULTRAM) 50 MG tablet Take 1 tablet (50 mg total) by mouth every 6 (six) hours as  needed for pain. 15 tablet 0     Review of Systems  Constitutional: Negative for fever, chills and malaise/fatigue.  Gastrointestinal: Negative for nausea, vomiting, abdominal pain, diarrhea and constipation.  Genitourinary: Positive for frequency. Negative for dysuria and urgency.       Lump on left labia Mild tenderness in left labia  Other systems reviewed and patient states she has no other symtoms  Physical Exam   Blood pressure 115/75, pulse 94, temperature 98.2 F (36.8 C), resp. rate 18, height 5\' 1"  (1.549 m), weight 133 lb 3.2 oz (60.419 kg).  Physical Exam  Constitutional: She is oriented to person, place, and time. She appears well-developed and well-nourished. No distress.  HENT:  Head: Normocephalic.  Cardiovascular: Normal rate and regular rhythm.   Respiratory: Effort normal. No respiratory distress.  GI: Soft. She exhibits no distension. There is no tenderness. There is no rebound and no guarding.  Genitourinary: Vagina normal.    No vaginal discharge found.  Right labia appears normal Left labia has a small 2cm area of very mild pink tissue with small amount of  swelling Very small point visible, but unable to express anything from it.  No induration, no fluctuance  Musculoskeletal: Normal range of motion.  Neurological: She is alert and oriented to person, place, and time.  Skin: Skin is warm and dry.  Psychiatric: She has a normal mood and affect.    MAU Course  Procedures  MDM Results for orders placed or performed during the hospital encounter of 03/28/15 (from the past 24 hour(s))  Urinalysis, Routine w reflex microscopic (not at [facility])     Status: Abnormal   Collection Time: 03/28/15  9:10 PM  Result Value Ref Range   Color, Urine YELLOW YELLOW   APPearance CLEAR CLEAR   Specific Gravity, Urine 1.015 1.005 - 1.030   pH 5.5 5.0 - 8.0   Glucose, UA NEGATIVE NEGATIVE mg/dL   Hgb urine dipstick MODERATE (A) NEGATIVE   Bilirubin Urine NEGATIVE  NEGATIVE   Ketones, ur NEGATIVE NEGATIVE mg/dL   Protein, ur NEGATIVE NEGATIVE mg/dL   Urobilinogen, UA 0.2 0.0 - 1.0 mg/dL   Nitrite NEGATIVE NEGATIVE   Leukocytes, UA NEGATIVE NEGATIVE  Urine microscopic-add on     Status: Abnormal   Collection Time: 03/28/15  9:10 PM  Result Value Ref Range   Squamous Epithelial / LPF RARE RARE   WBC, UA 0-2 <3 WBC/hpf   RBC / HPF 3-6 <3 RBC/hpf   Bacteria, UA FEW (A) RARE  Pregnancy, urine POC     Status: None   Collection Time: 03/28/15  9:42 PM  Result Value Ref Range   Preg Test, Ur NEGATIVE NEGATIVE     Assessment and Plan  A:  Left labial abscess, early       Not ready for Incision and drainage  P:  Discussed nature of skin abscesses and that this one is mild and early        Consulted Dr Hinton Rao with my diagnosis, patient exam and my recommendations, she agrees       Recommend warm soaks or compresses. Instructed to call office if this enlarges or becomes more painful       Recommended against application of vaseline       Follow up in office as needed for other gynecological problems  [facility] 03/28/2015, 9:20 PM

## 2015-03-28 NOTE — Discharge Instructions (Signed)
Abscess °An abscess (boil or furuncle) is an infected area on or under the skin. This area is filled with yellowish-white fluid (pus) and other material (debris). °HOME CARE  °· Only take medicines as told by your doctor. °· If you were given antibiotic medicine, take it as directed. Finish the medicine even if you start to feel better. °· If gauze is used, follow your doctor's directions for changing the gauze. °· To avoid spreading the infection: °¨ Keep your abscess covered with a bandage. °¨ Wash your hands well. °¨ Do not share personal care items, towels, or whirlpools with others. °¨ Avoid skin contact with others. °· Keep your skin and clothes clean around the abscess. °· Keep all doctor visits as told. °GET HELP RIGHT AWAY IF:  °· You have more pain, puffiness (swelling), or redness in the wound site. °· You have more fluid or blood coming from the wound site. °· You have muscle aches, chills, or you feel sick. °· You have a fever. °MAKE SURE YOU:  °· Understand these instructions. °· Will watch your condition. °· Will get help right away if you are not doing well or get worse. °Document Released: 03/08/2008 Document Revised: 03/21/2012 Document Reviewed: 12/03/2011 °ExitCare® Patient Information ©2015 [employer]. This information is not intended to replace advice given to you by your health care provider. Make sure you discuss any questions you have with your health care provider. ° °

## 2015-03-28 NOTE — MAU Note (Signed)
Awoke this morning with vaginal pain. Looked at vaginal area tonight and L side is swollen and see a bump there. Urinating more but no pain.

## 2015-03-28 NOTE — Progress Notes (Signed)
Only visual exam externally to visualize sore area

## 2016-03-05 ENCOUNTER — Encounter (HOSPITAL_COMMUNITY): Payer: Self-pay | Admitting: *Deleted

## 2016-03-05 ENCOUNTER — Emergency Department (HOSPITAL_COMMUNITY)
Admission: EM | Admit: 2016-03-05 | Discharge: 2016-03-05 | Disposition: A | Discharge status: Home / Self Care | Payer: Managed Care, Other (non HMO) | Attending: Dermatology | Admitting: Dermatology

## 2016-03-05 DIAGNOSIS — R51 Headache: Secondary | ICD-10-CM | POA: Diagnosis present

## 2016-03-05 DIAGNOSIS — Z87891 Personal history of nicotine dependence: Secondary | ICD-10-CM | POA: Diagnosis not present

## 2016-03-05 DIAGNOSIS — Z5321 Procedure and treatment not carried out due to patient leaving prior to being seen by health care provider: Secondary | ICD-10-CM | POA: Insufficient documentation

## 2016-03-05 DIAGNOSIS — Z79899 Other long term (current) drug therapy: Secondary | ICD-10-CM | POA: Diagnosis not present

## 2016-03-05 NOTE — ED Notes (Signed)
PT reports coughing, sneezing, nasal congestion, headache ("feels like a migraine"), and chest congestion for several days. Pt says she felt lightheaded at work on Thursday, comes and goes since then.

## 2016-03-05 NOTE — ED Notes (Signed)
Per registration clerk, pt left without being seen

## 2016-10-23 ENCOUNTER — Encounter (HOSPITAL_COMMUNITY): Payer: Self-pay | Admitting: Emergency Medicine

## 2016-10-23 DIAGNOSIS — Y999 Unspecified external cause status: Secondary | ICD-10-CM | POA: Diagnosis not present

## 2016-10-23 DIAGNOSIS — S3992XA Unspecified injury of lower back, initial encounter: Secondary | ICD-10-CM | POA: Insufficient documentation

## 2016-10-23 DIAGNOSIS — Z79899 Other long term (current) drug therapy: Secondary | ICD-10-CM | POA: Diagnosis not present

## 2016-10-23 DIAGNOSIS — M546 Pain in thoracic spine: Secondary | ICD-10-CM | POA: Insufficient documentation

## 2016-10-23 DIAGNOSIS — Y92410 Unspecified street and highway as the place of occurrence of the external cause: Secondary | ICD-10-CM | POA: Insufficient documentation

## 2016-10-23 DIAGNOSIS — M542 Cervicalgia: Secondary | ICD-10-CM | POA: Insufficient documentation

## 2016-10-23 DIAGNOSIS — Y939 Activity, unspecified: Secondary | ICD-10-CM | POA: Diagnosis not present

## 2016-10-23 DIAGNOSIS — Z87891 Personal history of nicotine dependence: Secondary | ICD-10-CM | POA: Insufficient documentation
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
## 2016-10-24 ENCOUNTER — Emergency Department (HOSPITAL_COMMUNITY)
Admission: EM | Admit: 2016-10-24 | Discharge: 2016-10-24 | Disposition: A | Discharge status: Home / Self Care | Payer: Managed Care, Other (non HMO) | Attending: Emergency Medicine | Admitting: Emergency Medicine

## 2016-10-24 ENCOUNTER — Emergency Department (HOSPITAL_COMMUNITY): Payer: Managed Care, Other (non HMO)

## 2016-10-24 DIAGNOSIS — S3992XA Unspecified injury of lower back, initial encounter: Secondary | ICD-10-CM | POA: Diagnosis not present

## 2016-10-24 LAB — POC URINE PREG, ED: Preg Test, Ur: NEGATIVE

## 2016-10-24 MED ORDER — LIDOCAINE 5 % EX PTCH
1.0000 | MEDICATED_PATCH | CUTANEOUS | 0 refills | Status: DC
Start: 2016-10-24 — End: 2021-02-26

## 2016-10-24 MED ORDER — METHOCARBAMOL 500 MG PO TABS: 500.0000 mg | ORAL_TABLET | Freq: Two times a day (BID) | ORAL | 0 refills | Status: DC

## 2016-10-24 MED ORDER — IBUPROFEN 800 MG PO TABS: 800.0000 mg | ORAL_TABLET | Freq: Three times a day (TID) | ORAL | 0 refills | Status: DC

## 2016-10-24 MED ORDER — IBUPROFEN 800 MG PO TABS
800.0000 mg | ORAL_TABLET | Freq: Once | ORAL | Status: AC
Start: 2016-10-24 — End: 2016-10-24
  Administered 2016-10-24: 800 mg via ORAL
  Filled 2016-10-24: qty 1

## 2016-10-24 NOTE — Discharge Instructions (Signed)
Expect your soreness to increase over the next 2-3 days. Take it easy, but do not lay around too much as this may make any stiffness worse.  Antiinflammatory medications: Take 500 mg of naproxen every 12 hours or 800 mg of ibuprofen every 8 hours for the next 3 days. Take these medications with food to avoid upset stomach. Choose only one of these medications, do not take them together.  Muscle relaxer: Robaxin is a muscle relaxer and may help loosen stiff muscles. Do not take the Robaxin while driving or performing other dangerous activities.   Lidocaine patches: These are available via either prescription or over-the-counter. The over-the-counter option may be more economical one and are likely just as effective. There are multiple over-the-counter brands, such as Salonpas.  Exercises: Be sure to perform the attached exercises starting with three times a week and working up to performing them daily. This is an essential part of preventing long term problems. Follow up with a primary care provider for any future management of these complaints.  You could have also sustained a head injury. It does not appear to be serious at this time.

## 2016-10-24 NOTE — ED Notes (Signed)
Patient transported to X-ray 
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
## 2017-11-18 ENCOUNTER — Ambulatory Visit: Payer: Managed Care, Other (non HMO) | Admitting: Podiatry

## 2018-03-16 ENCOUNTER — Ambulatory Visit: Payer: Managed Care, Other (non HMO) | Admitting: Podiatry

## 2018-03-20 ENCOUNTER — Ambulatory Visit: Payer: Managed Care, Other (non HMO) | Admitting: Podiatry
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
## 2018-04-17 ENCOUNTER — Ambulatory Visit: Payer: Managed Care, Other (non HMO) | Admitting: Podiatry

## 2018-06-02 ENCOUNTER — Encounter: Payer: Self-pay | Admitting: Sports Medicine

## 2018-06-02 ENCOUNTER — Ambulatory Visit: Payer: Managed Care, Other (non HMO) | Admitting: Sports Medicine

## 2018-06-02 ENCOUNTER — Ambulatory Visit (INDEPENDENT_AMBULATORY_CARE_PROVIDER_SITE_OTHER): Payer: Managed Care, Other (non HMO)

## 2018-06-02 DIAGNOSIS — M21619 Bunion of unspecified foot: Secondary | ICD-10-CM | POA: Diagnosis not present

## 2018-06-02 DIAGNOSIS — M2141 Flat foot [pes planus] (acquired), right foot: Secondary | ICD-10-CM | POA: Diagnosis not present

## 2018-06-02 DIAGNOSIS — M2142 Flat foot [pes planus] (acquired), left foot: Secondary | ICD-10-CM

## 2018-06-02 DIAGNOSIS — M25473 Effusion, unspecified ankle: Secondary | ICD-10-CM

## 2018-06-02 DIAGNOSIS — M79671 Pain in right foot: Secondary | ICD-10-CM

## 2018-06-02 DIAGNOSIS — M79672 Pain in left foot: Secondary | ICD-10-CM

## 2018-06-02 MED ORDER — MELOXICAM 15 MG PO TABS: 15.0000 mg | ORAL_TABLET | Freq: Every day | ORAL | 0 refills | Status: DC

## 2018-06-02 NOTE — Progress Notes (Signed)
Subjective: Rachel Finley is a 37 y.o. female patient who presents to office for evaluation of bilateral bunion pain for years that is mostly discomfort and foot pain along the bottoms and arch and pain to ankles with swelling that has worsened over the last 6 months.  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  Patient admits to a family history of bunions and denies any history of arthritides.  Patient reports that her bunions and foot pain is causing difficulty with shoe fitting.  Reports that she had to give away all of her high heels.  Patient denies any other pedal complaints.   Review of Systems  Cardiovascular: Positive for leg swelling.  Musculoskeletal: Positive for joint pain.  All other systems reviewed and are negative.   There are no active problems to display for this patient.   Current Outpatient Medications on File Prior to Visit  Medication Sig Dispense Refill  . ibuprofen (ADVIL,MOTRIN) 600 MG tablet Take 600 mg by mouth every 6 (six) hours as needed.    Marland Kitchen ibuprofen (ADVIL,MOTRIN) 800 MG tablet Take 1 tablet (800 mg total) by mouth 3 (three) times daily. 21 tablet 0  . levonorgestrel (MIRENA) 20 MCG/24HR IUD 1 each by Intrauterine route once.    . lidocaine (LIDODERM) 5 % Place 1 patch onto the skin daily. Remove & Discard patch within 12 hours or as directed by MD 30 patch 0  . methocarbamol (ROBAXIN) 500 MG tablet Take 1 tablet (500 mg total) by mouth 2 (two) times daily. 20 tablet 0  . traMADol (ULTRAM) 50 MG tablet Take 1 tablet (50 mg total) by mouth every 6 (six) hours as needed for pain. 15 tablet 0   No current facility-administered medications on file prior to visit.     Allergies  Allergen Reactions  . Latex Itching  . Codeine Rash    Tylenol codeine    Objective:  General: Alert and oriented x3 in no  acute distress  Dermatology: No open lesions bilateral lower extremities, no webspace macerations, no ecchymosis bilateral, all nails x 10 are well manicured.  Vascular: Dorsalis Pedis and Posterior Tibial pedal pulses 2/4, Capillary Fill Time 3 seconds, (+) pedal hair growth bilateral, trace edema bilateral lower extremities however at ankles there is considerable amount of soft tissue fullness likely benign and due to body habitus, Temperature gradient within normal limits.  Neurology: Gross sensation intact via light touch bilateral.  Musculoskeletal: Mild tenderness with palpation bunion deformity, no limitation or crepitus with range of motion, deformity reducible, tracking not trackbound, there is no 1st ray hypermobility noted bilateral. Midtarsal, Subtalar joint, and ankle joint range of motion is within normal limits with subjective pain diffusely on plantar surfaces of both feet. On weightbearing exam, there is medial arch collapse bilateral with rearfoot valgus, forefoot slight abduction with HAV deformity supported on ground with no second toe crossover deformity noted.    Xrays  Right/Left Foot and ankle   Impression: Intermetatarsal angle above normal limits supportive of bunion only there is also midtarsal breech supportive of pes planus.  No acute ankle pathology.  No other acute findings.       Assessment and Plan: Problem List Items Addressed This Visit    None    Visit Diagnoses    Foot pain, bilateral    -  Primary   Relevant Orders   DG Foot Complete Right   DG Foot Complete Left   Bunion       Pes planus of both feet       Ankle swelling, unspecified laterality           -Complete examination performed -Xrays reviewed -Discussed treatement options; discussed HAV deformity with pes planus and likely ankle impingement and possible dependent swelling;conservative and  Surgical management; risks, benefits, alternatives discussed. All patient's questions  answered. -Recommend patient to get compression garments from elastic therapy -Rx meloxicam to take as instructed -Recommend continue with good supportive shoes and will check orthotic coverage for custom insoles if not covered offered to patient to come back at a later time to pick up a set of power step insoles of which we will hold for her when she is ready if custom insoles are not covered -Patient to return to office as needed or sooner if condition worsens.  Asencion Islam, DPM

## 2018-06-06 ENCOUNTER — Other Ambulatory Visit: Payer: Self-pay | Admitting: Sports Medicine

## 2018-06-06 DIAGNOSIS — M79672 Pain in left foot: Principal | ICD-10-CM

## 2018-06-06 DIAGNOSIS — M21619 Bunion of unspecified foot: Secondary | ICD-10-CM

## 2018-06-06 DIAGNOSIS — M2141 Flat foot [pes planus] (acquired), right foot: Secondary | ICD-10-CM

## 2018-06-06 DIAGNOSIS — M79671 Pain in right foot: Secondary | ICD-10-CM

## 2018-06-06 DIAGNOSIS — M2142 Flat foot [pes planus] (acquired), left foot: Secondary | ICD-10-CM

## 2018-06-06 DIAGNOSIS — M25473 Effusion, unspecified ankle: Secondary | ICD-10-CM

## 2018-07-31 ENCOUNTER — Telehealth: Payer: Self-pay | Admitting: *Deleted

## 2018-08-01 NOTE — Telephone Encounter (Signed)
Pt asked if there was other therapy than surgery. I reviewed LOV 06/02/2018 and Dr. Marylene Land had offered orthotics if covered, and had offered PowerStep if pt would like. I informed pt of Dr. Wynema Birch alternatives and pt asked if she could pick up in the office and I told her she could. Pt states they don't fit all shoe. I told pt she could get a 3/4 style for casual and dress shoes.

## 2018-08-01 NOTE — Telephone Encounter (Signed)
Pt states she was seen about 1 month ago, and has questions.

## 2018-08-01 NOTE — Telephone Encounter (Signed)
Great, Thank you!

## 2018-08-11 ENCOUNTER — Other Ambulatory Visit: Payer: Self-pay

## 2018-08-11 ENCOUNTER — Emergency Department (HOSPITAL_COMMUNITY)
Admission: EM | Admit: 2018-08-11 | Discharge: 2018-08-11 | Disposition: A | Discharge status: Home / Self Care | Payer: Managed Care, Other (non HMO) | Attending: Emergency Medicine | Admitting: Emergency Medicine

## 2018-08-11 ENCOUNTER — Encounter (HOSPITAL_COMMUNITY): Payer: Self-pay | Admitting: Obstetrics and Gynecology

## 2018-08-11 DIAGNOSIS — M25512 Pain in left shoulder: Secondary | ICD-10-CM | POA: Insufficient documentation

## 2018-08-11 DIAGNOSIS — M542 Cervicalgia: Secondary | ICD-10-CM | POA: Insufficient documentation

## 2018-08-11 DIAGNOSIS — Z79899 Other long term (current) drug therapy: Secondary | ICD-10-CM | POA: Diagnosis not present

## 2018-08-11 MED ORDER — METHOCARBAMOL 500 MG PO TABS: 500.0000 mg | ORAL_TABLET | Freq: Two times a day (BID) | ORAL | 0 refills | Status: AC

## 2018-08-11 NOTE — ED Notes (Signed)
Discharge instructions reviewed with patient. Patient verbalizes understanding. VSS.   
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
## 2018-08-29 ENCOUNTER — Encounter: Payer: Self-pay | Admitting: *Deleted

## 2018-08-29 ENCOUNTER — Other Ambulatory Visit: Payer: Self-pay | Admitting: *Deleted

## 2018-08-30 ENCOUNTER — Encounter: Payer: Self-pay | Admitting: Neurology

## 2018-08-30 ENCOUNTER — Ambulatory Visit (INDEPENDENT_AMBULATORY_CARE_PROVIDER_SITE_OTHER): Payer: Managed Care, Other (non HMO) | Admitting: Neurology

## 2018-08-30 DIAGNOSIS — M545 Low back pain, unspecified: Secondary | ICD-10-CM

## 2018-08-30 DIAGNOSIS — G8929 Other chronic pain: Secondary | ICD-10-CM | POA: Diagnosis not present

## 2018-08-30 HISTORY — DX: Low back pain, unspecified: M54.50

## 2018-08-30 MED ORDER — NORTRIPTYLINE HCL 10 MG PO CAPS: ORAL_CAPSULE | ORAL | 3 refills | Status: DC

## 2018-08-30 MED ORDER — MELOXICAM 7.5 MG PO TABS: 7.5000 mg | ORAL_TABLET | Freq: Every day | ORAL | 1 refills | Status: DC
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
## 2018-08-30 NOTE — Patient Instructions (Signed)
We will start nortriptyline for the back pain and get back on Mobic 7.5 mg daily with food.   Pamelor (nortriptyline) is an antidepressant medication that has many uses that may include headache, whiplash injuries, or for peripheral neuropathy pain. Side effects may include drowsiness, dry mouth, blurred vision, or constipation. As with any antidepressant medication, worsening depression may occur. If you had any significant side effects, please call our office. The full effects of this medication may take 7-10 days after starting the drug, or going up on the dose.

## 2018-10-31 ENCOUNTER — Encounter (HOSPITAL_COMMUNITY): Payer: Self-pay | Admitting: Obstetrics and Gynecology

## 2018-10-31 ENCOUNTER — Emergency Department (HOSPITAL_COMMUNITY)
Admission: EM | Admit: 2018-10-31 | Discharge: 2018-10-31 | Disposition: A | Discharge status: Home / Self Care | Payer: Managed Care, Other (non HMO) | Attending: Emergency Medicine | Admitting: Emergency Medicine

## 2018-10-31 ENCOUNTER — Other Ambulatory Visit: Payer: Self-pay

## 2018-10-31 DIAGNOSIS — Z91040 Latex allergy status: Secondary | ICD-10-CM | POA: Insufficient documentation

## 2018-10-31 DIAGNOSIS — Z87891 Personal history of nicotine dependence: Secondary | ICD-10-CM | POA: Insufficient documentation

## 2018-10-31 DIAGNOSIS — Z79899 Other long term (current) drug therapy: Secondary | ICD-10-CM | POA: Diagnosis not present

## 2018-10-31 DIAGNOSIS — M549 Dorsalgia, unspecified: Secondary | ICD-10-CM | POA: Diagnosis not present

## 2018-10-31 LAB — PREGNANCY, URINE: Preg Test, Ur: NEGATIVE

## 2018-10-31 MED ORDER — METHOCARBAMOL 500 MG PO TABS: 500.0000 mg | ORAL_TABLET | Freq: Two times a day (BID) | ORAL | 0 refills | Status: DC

## 2018-10-31 MED ORDER — NAPROXEN 375 MG PO TABS: 375.0000 mg | ORAL_TABLET | Freq: Two times a day (BID) | ORAL | 0 refills | Status: DC
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
## 2019-03-26 ENCOUNTER — Telehealth: Payer: Self-pay | Admitting: Neurology

## 2019-03-26 NOTE — Telephone Encounter (Signed)
Noted  

## 2019-03-26 NOTE — Telephone Encounter (Signed)
Due to current COVID 19 pandemic, our office is severely reducing in office visits until further notice, in order to minimize the risk to our patients and healthcare providers.   I called patient regarding her 6/23 appt. LVM requesting pt call back. Office contact info included.

## 2019-03-26 NOTE — Telephone Encounter (Signed)
Patient returned my call and stated that she feels that having a follow-up at this time would be pointless because she has not been taking the medicine prescribed by Dr. Jannifer Franklin. Patient states that she forgets to take her medicine and only takes it if she notices that she swells. She requested that the appt be cancelled for now and she will call back to reschedule.

## 2019-03-27 ENCOUNTER — Ambulatory Visit: Payer: Managed Care, Other (non HMO) | Admitting: Neurology

## 2019-08-03 ENCOUNTER — Other Ambulatory Visit: Payer: Self-pay | Admitting: Sports Medicine

## 2019-08-03 ENCOUNTER — Ambulatory Visit: Payer: Managed Care, Other (non HMO) | Admitting: Sports Medicine

## 2019-08-03 ENCOUNTER — Encounter: Payer: Self-pay | Admitting: Sports Medicine

## 2019-08-03 ENCOUNTER — Other Ambulatory Visit: Payer: Self-pay

## 2019-08-03 ENCOUNTER — Ambulatory Visit (INDEPENDENT_AMBULATORY_CARE_PROVIDER_SITE_OTHER): Payer: Managed Care, Other (non HMO)

## 2019-08-03 DIAGNOSIS — M25473 Effusion, unspecified ankle: Secondary | ICD-10-CM

## 2019-08-03 DIAGNOSIS — M79671 Pain in right foot: Secondary | ICD-10-CM

## 2019-08-03 DIAGNOSIS — M779 Enthesopathy, unspecified: Secondary | ICD-10-CM

## 2019-08-03 DIAGNOSIS — M722 Plantar fascial fibromatosis: Secondary | ICD-10-CM

## 2019-08-03 DIAGNOSIS — M79672 Pain in left foot: Secondary | ICD-10-CM

## 2019-08-03 DIAGNOSIS — M2142 Flat foot [pes planus] (acquired), left foot: Secondary | ICD-10-CM

## 2019-08-03 DIAGNOSIS — M2141 Flat foot [pes planus] (acquired), right foot: Secondary | ICD-10-CM | POA: Diagnosis not present

## 2019-08-03 MED ORDER — DICLOFENAC SODIUM 75 MG PO TBEC: 75.0000 mg | DELAYED_RELEASE_TABLET | Freq: Two times a day (BID) | ORAL | 0 refills | Status: DC

## 2019-08-03 MED ORDER — TRIAMCINOLONE ACETONIDE 10 MG/ML IJ SUSP
10.0000 mg | Freq: Once | INTRAMUSCULAR | Status: AC
Start: 2019-08-03 — End: 2019-08-03
  Administered 2019-08-03: 12:00:00 10 mg

## 2019-08-03 NOTE — Progress Notes (Signed)
Subjective: Rachel Finley is a 38 y.o. female patient who presents to office for evaluation of bilateral foot pain. Reports that pain over the last 1 month has gotten worse especially at work when on feet. 9/10 sharp pain constant in nature.  Patient reports that last year she tried the over-the-counter insoles topical pain cream and rub and Epson salt soaks with no relief.  Patient reports that the most comfortable shoes are her crocs.  Patient also reports that she has been using compression sleeve for swelling which helps but does not help her foot pain.  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  Patient denies any new injury or any other pedal complaints at this time.   Patient Active Problem List   Diagnosis Date Noted  . Chronic low back pain 08/30/2018    Current Outpatient Medications on File Prior to Visit  Medication Sig Dispense Refill  . citalopram (CELEXA) 10 MG tablet citalopram 10 mg tablet  TAKE ONE TABLET BY MOUTH EVERY DAY    . levonorgestrel (MIRENA) 20 MCG/24HR IUD 1 each by Intrauterine route once.    . lidocaine (LIDODERM) 5 % Place 1 patch onto the skin daily. Remove & Discard patch within 12 hours or as directed by MD (Patient not taking: Reported on 08/30/2018) 30 patch 0  . medroxyPROGESTERone (DEPO-PROVERA) 150 MG/ML injection medroxyprogesterone 150 mg/mL intramuscular suspension  Inject 1 mL every 3 months by intramuscular route.    . methocarbamol (ROBAXIN) 500 MG tablet Take 1 tablet (500 mg total) by mouth 2 (two) times daily. 14 tablet 0  . naproxen (NAPROSYN) 375 MG tablet Take 1 tablet (375 mg total) by mouth 2 (two) times daily. 20 tablet 0  . nortriptyline (PAMELOR) 10 MG capsule Take one capsule at night for one week, then take 2 capsules at night for one week, then take 3 capsules at night 90 capsule 3  . SUMAtriptan (IMITREX) 100 MG tablet sumatriptan 100 mg tablet  Take 1 (one) Tablet at onset of severe headache     . traMADol (ULTRAM) 50 MG tablet Take 1 tablet (50 mg total) by mouth every 6 (six) hours as needed for pain. 15 tablet 0   No current facility-administered medications on file prior to visit.     Allergies  Allergen Reactions  . Latex Itching  . Codeine Rash    Tylenol codeine    Objective:  General: Alert and oriented x3 in no acute distress  Dermatology: No open lesions bilateral lower extremities, no webspace macerations, no ecchymosis bilateral, all nails x 10 are well manicured.  Vascular: Dorsalis Pedis and Posterior Tibial pedal pulses 2/4, Capillary Fill Time 3 seconds, (+) pedal hair growth bilateral, trace edema bilateral lower extremities however at ankles there is a little more swelling on her left ankle as compared to the right, temperature gradient within normal limits.  Neurology: Michaell Cowing sensation intact via light touch bilateral.  Musculoskeletal: Mild tenderness at plantar fascial insertion left greater than right with ankle swelling.  There is also pain along the posterior tibial tendon course bilateral left greater than right.  Unchanged bunion and mild hammertoe deformity.   Xrays  Right/Left Foot and ankle   Impression: Intermetatarsal angle above normal limits supportive of bunion only there is also midtarsal breech supportive of pes planus.  No acute ankle pathology.  No other acute findings.  No changes from prior.       Assessment and Plan: Problem List Items  Addressed This Visit    None    Visit Diagnoses    Plantar fasciitis, left    -  Primary   Tendinitis       Pes planus of both feet       Foot pain, bilateral       Ankle swelling, unspecified laterality           -Complete examination performed -Xrays reviewed -Discussed treatment options for tendinitis at posterior tibial tendon due to flatfeet as well as plantar fasciitis on left -After oral consent and aseptic prep, injected a mixture containing 1 ml of 2%  plain lidocaine, 1 ml 0.5%  plain marcaine, 0.5 ml of kenalog 10 and 0.5 ml of dexamethasone phosphate into left heel at plantar fascial glabrous junction without complication. Post-injection care discussed with patient.  -Applied plantar fascial taping bilateral and advised patient if this works well may benefit from custom insoles -Rx diclofenac to take as instructed -Recommend continue with good supportive shoes  -May continue with soaking with Epson salt topical pain creams gentle stretching and icing as needed -Patient to return to office 1 month or sooner if condition worsens.  Landis Martins, DPM

## 2019-09-07 ENCOUNTER — Other Ambulatory Visit: Payer: Self-pay

## 2019-09-07 ENCOUNTER — Ambulatory Visit: Payer: Managed Care, Other (non HMO) | Admitting: Sports Medicine

## 2019-09-07 ENCOUNTER — Encounter: Payer: Self-pay | Admitting: Sports Medicine

## 2019-09-07 DIAGNOSIS — M79671 Pain in right foot: Secondary | ICD-10-CM | POA: Diagnosis not present

## 2019-09-07 DIAGNOSIS — M779 Enthesopathy, unspecified: Secondary | ICD-10-CM

## 2019-09-07 DIAGNOSIS — M2141 Flat foot [pes planus] (acquired), right foot: Secondary | ICD-10-CM | POA: Diagnosis not present

## 2019-09-07 DIAGNOSIS — M79672 Pain in left foot: Secondary | ICD-10-CM

## 2019-09-07 DIAGNOSIS — M2142 Flat foot [pes planus] (acquired), left foot: Secondary | ICD-10-CM

## 2019-09-07 DIAGNOSIS — M722 Plantar fascial fibromatosis: Secondary | ICD-10-CM

## 2019-09-07 NOTE — Patient Instructions (Signed)
Voltaren or Aspercreme pain patch

## 2019-09-07 NOTE — Progress Notes (Signed)
Subjective: Rachel Finley is a 38 y.o. female patient who returns office for follow-up evaluation of bilateral pain.  Patient reports that her left foot feels better after injection and reports that she only has pain when she tries to wear shoe with a little bit of hyper heel to it but otherwise doing much better.  Patient reports that she did not take the diclofenac because she was afraid of possible side effects and wanted to further discuss it with me before she starts to take it.  Patient reports that she still has swelling but otherwise the pain seems to be a little better.  Patient denies any new pedal complaints at this time.   Patient Active Problem List   Diagnosis Date Noted  . Chronic low back pain 08/30/2018    Current Outpatient Medications on File Prior to Visit  Medication Sig Dispense Refill  . citalopram (CELEXA) 10 MG tablet citalopram 10 mg tablet  TAKE ONE TABLET BY MOUTH EVERY DAY    . diclofenac (VOLTAREN) 75 MG EC tablet Take 1 tablet (75 mg total) by mouth 2 (two) times daily. 30 tablet 0  . levonorgestrel (MIRENA) 20 MCG/24HR IUD 1 each by Intrauterine route once.    . lidocaine (LIDODERM) 5 % Place 1 patch onto the skin daily. Remove & Discard patch within 12 hours or as directed by MD (Patient not taking: Reported on 08/30/2018) 30 patch 0  . medroxyPROGESTERone (DEPO-PROVERA) 150 MG/ML injection medroxyprogesterone 150 mg/mL intramuscular suspension  Inject 1 mL every 3 months by intramuscular route.    . methocarbamol (ROBAXIN) 500 MG tablet Take 1 tablet (500 mg total) by mouth 2 (two) times daily. 14 tablet 0  . naproxen (NAPROSYN) 375 MG tablet Take 1 tablet (375 mg total) by mouth 2 (two) times daily. 20 tablet 0  . nortriptyline (PAMELOR) 10 MG capsule Take one capsule at night for one week, then take 2 capsules at night for one week, then take 3 capsules at night 90 capsule 3  . SUMAtriptan (IMITREX) 100 MG tablet sumatriptan 100 mg tablet  Take 1 (one)  Tablet at onset of severe headache    . traMADol (ULTRAM) 50 MG tablet Take 1 tablet (50 mg total) by mouth every 6 (six) hours as needed for pain. 15 tablet 0   No current facility-administered medications on file prior to visit.     Allergies  Allergen Reactions  . Latex Itching  . Codeine Rash    Tylenol codeine    Objective:  General: Alert and oriented x3 in no acute distress  Dermatology: No open lesions bilateral lower extremities, no webspace macerations, no ecchymosis bilateral, all nails x 10 are well manicured.  Vascular: Dorsalis Pedis and Posterior Tibial pedal pulses 2/4, Capillary Fill Time 3 seconds, (+) pedal hair growth bilateral, trace edema.  Neurology: Michaell Cowing sensation intact via light touch bilateral.  Musculoskeletal: Mild tenderness at plantar fascial insertion left greater than right with ankle swelling.  There is also pain along the posterior tibial tendon course bilateral left greater than right.  Midtarsal breech supportive of pes planus deformity bilateral.  + bunion and mild hammertoe deformity.       Assessment and Plan: Problem List Items Addressed This Visit    None    Visit Diagnoses    Plantar fasciitis, left    -  Primary   Tendinitis       Pes planus of both feet       Foot pain, bilateral           -  Complete examination performed -Re- discussed treatment options for tendinitis and plantar fasciitis secondary to pes planus -Patient refused repeat injection at this visit -Advised patient to pick up diclofenac to take as instructed -Recommend continue with good supportive shoes and over-the-counter insoles as tolerated advised patient to take shoe liner out of shoe to give space so that way her shoe does not fit too tight -May continue with soaking with Epson salt topical pain creams gentle stretching and icing as needed -Encourage patient to use compression garments for edema control -Patient to return to office as needed or sooner if  condition worsens.  Landis Martins, DPM

## 2020-04-03 ENCOUNTER — Encounter: Payer: Self-pay | Admitting: Sports Medicine

## 2020-04-03 ENCOUNTER — Ambulatory Visit (INDEPENDENT_AMBULATORY_CARE_PROVIDER_SITE_OTHER): Payer: Managed Care, Other (non HMO)

## 2020-04-03 ENCOUNTER — Other Ambulatory Visit: Payer: Self-pay

## 2020-04-03 ENCOUNTER — Ambulatory Visit (INDEPENDENT_AMBULATORY_CARE_PROVIDER_SITE_OTHER): Payer: Managed Care, Other (non HMO) | Admitting: Sports Medicine

## 2020-04-03 ENCOUNTER — Other Ambulatory Visit: Payer: Self-pay | Admitting: Sports Medicine

## 2020-04-03 DIAGNOSIS — M722 Plantar fascial fibromatosis: Secondary | ICD-10-CM | POA: Diagnosis not present

## 2020-04-03 DIAGNOSIS — M79672 Pain in left foot: Secondary | ICD-10-CM

## 2020-04-03 DIAGNOSIS — M21619 Bunion of unspecified foot: Secondary | ICD-10-CM | POA: Diagnosis not present

## 2020-04-03 DIAGNOSIS — M79671 Pain in right foot: Secondary | ICD-10-CM

## 2020-04-03 DIAGNOSIS — M069 Rheumatoid arthritis, unspecified: Secondary | ICD-10-CM | POA: Diagnosis not present

## 2020-04-03 NOTE — Progress Notes (Signed)
Subjective: Rachel Finley is a 39 y.o. female returns to office for follow up evaluation of bilateral foot pain.  Patient reports that pain is worse on left greater than right over the last 3 to 4 months constant in nature slowly getting worse reports that there is also been swelling left greater than right got a little worse after she came back from [place] reports that there is random sharp shooting pains over her bunion and pain worse with shoes to this area as well as pain to the arches and heels reports that previous over-the-counter orthotics did not help Epson salt and elevation has not helped and reports that her feet hurt so bad that she cannot deal with it anymore.   Patient Active Problem List   Diagnosis Date Noted  . Chronic low back pain 08/30/2018    Current Outpatient Medications on File Prior to Visit  Medication Sig Dispense Refill  . citalopram (CELEXA) 10 MG tablet citalopram 10 mg tablet  TAKE ONE TABLET BY MOUTH EVERY DAY    . diclofenac (VOLTAREN) 75 MG EC tablet Take 1 tablet (75 mg total) by mouth 2 (two) times daily. 30 tablet 0  . levonorgestrel (MIRENA) 20 MCG/24HR IUD 1 each by Intrauterine route once.    . lidocaine (LIDODERM) 5 % Place 1 patch onto the skin daily. Remove & Discard patch within 12 hours or as directed by MD (Patient not taking: Reported on 08/30/2018) 30 patch 0  . medroxyPROGESTERone (DEPO-PROVERA) 150 MG/ML injection medroxyprogesterone 150 mg/mL intramuscular suspension  Inject 1 mL every 3 months by intramuscular route.    . methocarbamol (ROBAXIN) 500 MG tablet Take 1 tablet (500 mg total) by mouth 2 (two) times daily. 14 tablet 0  . naproxen (NAPROSYN) 375 MG tablet Take 1 tablet (375 mg total) by mouth 2 (two) times daily. 20 tablet 0  . nortriptyline (PAMELOR) 10 MG capsule Take one capsule at night for one week, then take 2 capsules at night for one week, then take 3 capsules at night 90 capsule 3  . SUMAtriptan (IMITREX) 100 MG tablet  sumatriptan 100 mg tablet  Take 1 (one) Tablet at onset of severe headache    . traMADol (ULTRAM) 50 MG tablet Take 1 tablet (50 mg total) by mouth every 6 (six) hours as needed for pain. 15 tablet 0   No current facility-administered medications on file prior to visit.    Allergies  Allergen Reactions  . Latex Itching  . Codeine Rash    Tylenol codeine    Objective:   General:  Alert and oriented x 3, in no acute distress  Dermatology: Skin is warm, dry, and supple bilateral. Nails are within normal limits. There is no lower extremity erythema, no eccymosis, no open lesions present bilateral.   Vascular: Dorsalis Pedis and Posterior Tibial pedal pulses are 2/4 bilateral. + hair growth noted bilateral. Capillary Fill Time is 3 seconds in all digits. No varicosities, trace edema left greater than right bilateral lower extremities.  Neurological: Sensation grossly intact to light touch bilateral.  Musculoskeletal: There is pain with palpation along the medial arch and medial calcaneal tubercle at the insertion of the plantar fascia consistent with plantar fasciitis left greater than right.  There is also mild tenderness noted to palpation of bilateral bunions left greater than right with subjective sharp shooting pains occasionally through these areas.  Pes planus foot type.  There is decreased Ankle joint range of motion bilateral. All other jointsrange of motion  within normal limits bilateral. Strength 5/5 bilateral.   Assessment and Plan: Problem List Items Addressed This Visit    None    Visit Diagnoses    Rheumatoid arthritis involving both feet, unspecified whether rheumatoid factor present ([place])    -  Primary   Relevant Orders   Uric acid   Sedimentation rate   C-reactive protein   Rheumatoid factor   ANA, IFA Comprehensive Panel   HLA-B27 antigen   CBC with Differential/Platelet   Plantar fasciitis, bilateral       Bunion       Foot pain, bilateral           -Complete examination performed.  -X-rays reviewed consistent with midtarsal breech and pes planus deformity, minimal calcaneal inferior heel spur, mild soft tissue swelling, mild increase in intermetatarsal angle left greater than right consistent with bunion deformity no other acute findings. -Discussed with patient in detail the condition of plantar fasciitis flareup and bunion pain/capsulitis, how this occurs related to the foot type of the patient and general treatment options. - Patient opted for another injection today; After oral consent and aseptic prep, injected a mixture containing 1 ml of 1%plain lidocaine, 1 ml 0.5% plain marcaine, 0.5 ml of kenalog 10 and 0.5 ml of dexmethasone phosphate to left and right heel at area of most pain/trigger point injection. -Applied plantar fascial strapping bilateral with instructions to keep in place for 5 days if works then will benefit from custom insoles  -Continue with stretching, icing, good supportive shoes daily  -Discussed long term care and reocurrence; will closely monitor; if fails to improve will consider other treatment modalities.  -Rx Arthritic panel family history of gout  -Patient to return to office after blood work for follow up or sooner if problems or questions arise.  Landis Martins, DPM

## 2020-04-18 ENCOUNTER — Other Ambulatory Visit: Payer: Self-pay | Admitting: Sports Medicine

## 2020-04-18 DIAGNOSIS — M722 Plantar fascial fibromatosis: Secondary | ICD-10-CM

## 2021-02-26 ENCOUNTER — Other Ambulatory Visit: Payer: Self-pay

## 2021-02-26 DIAGNOSIS — G629 Polyneuropathy, unspecified: Secondary | ICD-10-CM | POA: Insufficient documentation

## 2021-02-26 DIAGNOSIS — M2142 Flat foot [pes planus] (acquired), left foot: Secondary | ICD-10-CM | POA: Insufficient documentation

## 2021-02-26 DIAGNOSIS — M2141 Flat foot [pes planus] (acquired), right foot: Secondary | ICD-10-CM | POA: Insufficient documentation

## 2021-02-26 DIAGNOSIS — N390 Urinary tract infection, site not specified: Secondary | ICD-10-CM | POA: Insufficient documentation

## 2021-02-26 DIAGNOSIS — G5603 Carpal tunnel syndrome, bilateral upper limbs: Secondary | ICD-10-CM | POA: Insufficient documentation

## 2021-02-26 DIAGNOSIS — M546 Pain in thoracic spine: Secondary | ICD-10-CM | POA: Insufficient documentation

## 2021-02-26 DIAGNOSIS — F411 Generalized anxiety disorder: Secondary | ICD-10-CM | POA: Insufficient documentation

## 2021-02-26 DIAGNOSIS — N751 Abscess of Bartholin's gland: Secondary | ICD-10-CM

## 2021-02-26 DIAGNOSIS — R42 Dizziness and giddiness: Secondary | ICD-10-CM | POA: Insufficient documentation

## 2021-02-26 DIAGNOSIS — R519 Headache, unspecified: Secondary | ICD-10-CM | POA: Insufficient documentation

## 2021-02-26 DIAGNOSIS — A599 Trichomoniasis, unspecified: Secondary | ICD-10-CM | POA: Insufficient documentation

## 2021-02-26 DIAGNOSIS — T7840XA Allergy, unspecified, initial encounter: Secondary | ICD-10-CM | POA: Insufficient documentation

## 2021-02-26 DIAGNOSIS — Z6830 Body mass index (BMI) 30.0-30.9, adult: Secondary | ICD-10-CM | POA: Insufficient documentation

## 2021-02-26 DIAGNOSIS — Z8669 Personal history of other diseases of the nervous system and sense organs: Secondary | ICD-10-CM | POA: Insufficient documentation

## 2021-02-26 DIAGNOSIS — F410 Panic disorder [episodic paroxysmal anxiety] without agoraphobia: Secondary | ICD-10-CM | POA: Insufficient documentation

## 2021-02-26 HISTORY — DX: Abscess of Bartholin's gland: N75.1

## 2021-03-20 ENCOUNTER — Ambulatory Visit: Payer: Managed Care, Other (non HMO) | Admitting: Cardiology

## 2021-03-20 ENCOUNTER — Other Ambulatory Visit: Payer: Self-pay

## 2021-03-20 ENCOUNTER — Encounter: Payer: Self-pay | Admitting: Cardiology

## 2021-03-20 DIAGNOSIS — I441 Atrioventricular block, second degree: Secondary | ICD-10-CM

## 2021-03-20 DIAGNOSIS — R55 Syncope and collapse: Secondary | ICD-10-CM

## 2021-03-20 HISTORY — DX: Atrioventricular block, second degree: I44.1

## 2021-03-20 HISTORY — DX: Syncope and collapse: R55

## 2021-03-20 NOTE — Patient Instructions (Signed)
Medication Instructions:  No medication changes. *If you need a refill on your cardiac medications before your next appointment, please call your pharmacy*   Lab Work: None ordered If you have labs (blood work) drawn today and your tests are completely normal, you will receive your results only by: MyChart Message (if you have MyChart) OR A paper copy in the mail If you have any lab test that is abnormal or we need to change your treatment, we will call you to review the results.   Testing/Procedures: Your physician has requested that you have an echocardiogram. Echocardiography is a painless test that uses sound waves to create images of your heart. It provides your doctor with information about the size and shape of your heart and how well your heart's chambers and valves are working. This procedure takes approximately one hour. There are no restrictions for this procedure.    Follow-Up: At [facility], you and your health needs are our priority.  As part of our continuing mission to provide you with exceptional heart care, we have created designated Provider Care Teams.  These Care Teams include your primary Cardiologist (physician) and Advanced Practice Providers (APPs -  Physician Assistants and Nurse Practitioners) who all work together to provide you with the care you need, when you need it.  We recommend signing up for the patient portal called "MyChart".  Sign up information is provided on this After Visit Summary.  MyChart is used to connect with patients for Virtual Visits (Telemedicine).  Patients are able to view lab/test results, encounter notes, upcoming appointments, etc.  Non-urgent messages can be sent to your provider as well.   To learn more about what you can do with MyChart, go to [email or web address].    Your next appointment:   3 month(s)  The format for your next appointment:   In Person  Provider:   Rajan Revankar, MD   Other  Instructions Echocardiogram An echocardiogram is a test that uses sound waves (ultrasound) to produce images of the heart. Images from an echocardiogram can provide important information about: Heart size and shape. The size and thickness and movement of your heart's walls. Heart muscle function and strength. Heart valve function or if you have stenosis. Stenosis is when the heart valves are too narrow. If blood is flowing backward through the heart valves (regurgitation). A tumor or infectious growth around the heart valves. Areas of heart muscle that are not working well because of poor blood flow or injury from a heart attack. Aneurysm detection. An aneurysm is a weak or damaged part of an artery wall. The wall bulges out from the normal force of blood pumping through the body. Tell a health care provider about: Any allergies you have. All medicines you are taking, including vitamins, herbs, eye drops, creams, and over-the-counter medicines. Any blood disorders you have. Any surgeries you have had. Any medical conditions you have. Whether you are pregnant or may be pregnant. What are the risks? Generally, this is a safe test. However, problems may occur, including an allergic reaction to dye (contrast) that may be used during the test. What happens before the test? No specific preparation is needed. You may eat and drink normally. What happens during the test? You will take off your clothes from the waist up and put on a hospital gown. Electrodes or electrocardiogram (ECG)patches may be placed on your chest. The electrodes or patches are then connected to a device that monitors your heart rate and rhythm. You will   lie down on a table for an ultrasound exam. A gel will be applied to your chest to help sound waves pass through your skin. A handheld device, called a transducer, will be pressed against your chest and moved over your heart. The transducer produces sound waves that travel to  your heart and bounce back (or "echo" back) to the transducer. These sound waves will be captured in real-time and changed into images of your heart that can be viewed on a video monitor. The images will be recorded on a computer and reviewed by your health care provider. You may be asked to change positions or hold your breath for a short time. This makes it easier to get different views or better views of your heart. In some cases, you may receive contrast through an IV in one of your veins. This can improve the quality of the pictures from your heart. The procedure may vary among health care providers and hospitals.   What can I expect after the test? You may return to your normal, everyday life, including diet, activities, and medicines, unless your health care provider tells you not to do that. Follow these instructions at home: It is up to you to get the results of your test. Ask your health care provider, or the department that is doing the test, when your results will be ready. Keep all follow-up visits. This is important. Summary An echocardiogram is a test that uses sound waves (ultrasound) to produce images of the heart. Images from an echocardiogram can provide important information about the size and shape of your heart, heart muscle function, heart valve function, and other possible heart problems. You do not need to do anything to prepare before this test. You may eat and drink normally. After the echocardiogram is completed, you may return to your normal, everyday life, unless your health care provider tells you not to do that. This information is not intended to replace advice given to you by your health care provider. Make sure you discuss any questions you have with your health care provider. Document Revised: 05/13/2020 Document Reviewed: 05/13/2020 Elsevier Patient Education  2021 Elsevier Inc.   

## 2021-03-20 NOTE — Progress Notes (Signed)
Cardiology Office Note:    Date:  03/20/2021   ID:  Rachel Finley, DOB October 21, 1980, MRN [id number]  PCP:  Marylen Ponto, MD  Cardiologist:  Garwin Brothers, MD   Referring MD: Marylen Ponto, MD    ASSESSMENT:    1. Syncope, unspecified syncope type   2. Atrioventricular block, Mobitz type 1, Wenckebach    PLAN:    In order of problems listed above:  Syncope in a patient with bradycardia which of course happened at night during possibly sleep hours.  Patient also has Wenckebach phenomenon on the monitor and this is of concern in the respect to her symptoms.  Her TSH is fine.  She is on no medications that can slow her down.  1 aspect of her history suggest the possibility of postural hypotension.  I told her to take extra salt and water in the diet.  Nevertheless her conduction system and electrical system of the heart needs evaluation and this would be best done by our electrophysiology colleagues to which I will send her for an evaluation.  In view of simple.  I have advised her not to drive and restrictions were advised and she understands. Cardiac murmur: Echocardiogram will be done to assess murmur heard on auscultation. She knows to go to the nearest emergency room for any significant symptoms. Patient will be seen in follow-up appointment in 3 months or earlier if the patient has any concerns    Medication Adjustments/Labs and Tests Ordered: Current medicines are reviewed at length with the patient today.  Concerns regarding medicines are outlined above.  No orders of the defined types were placed in this encounter.  No orders of the defined types were placed in this encounter.    History of Present Illness:    Rachel Finley is a 40 y.o. female who is being seen today for the evaluation of syncope with abnormal and Wenckebach on the monitor at the request of Marylen Ponto, MD. patient is a pleasant 40 year old female.  She is an interesting lady.  She has been having  dizzy spells and fainting spells.  She mentions to me that an event monitoring was done and this revealed nighttime bradycardia and also Wenckebach phenomenon therefore she was sent here for evaluation.  She mentions to me that she passed out last time about a month ago and when she was standing she just abruptly syncopized.  The patient mentions to me that during the time of the monitor she had no symptoms.  She denies any hypertension dyslipidemia diabetes mellitus.  She is not on any medications that can slow her heart rate up.  At the time of my evaluation, the patient is alert awake oriented and in no distress.  She is accompanied by her mother.  Past Medical History:  Diagnosis Date   Allergy    Bartholin's gland abscess 02/26/2021   BMI 30.0-30.9,adult    Carpal tunnel syndrome, bilateral    Chronic low back pain 08/30/2018   Dizziness    Flat feet    Generalized anxiety disorder with panic attacks    Headache    Headache(784.0)    History of migraine headaches    Neuropathy    Thoracic back pain    Trichimoniasis    Trichomonal vulvovaginitis 12/03/2014   Urinary tract infection     Past Surgical History:  Procedure Laterality Date   TOOTH EXTRACTION     scheduled for Monday Dec 22nd 2014    Current  Medications: Current Meds  Medication Sig   ibuprofen (ADVIL) 800 MG tablet Take 800 mg by mouth as needed for pain.   naproxen (NAPROSYN) 375 MG tablet Take 375 mg by mouth as needed for mild pain.     Allergies:   Latex and Codeine   Social History   Socioeconomic History   Marital status: Single    Spouse name: Not on file   Number of children: Not on file   Years of education: Not on file   Highest education level: Not on file  Occupational History   Not on file  Tobacco Use   Smoking status: Former    Pack years: 0.00   Smokeless tobacco: Never   Tobacco comments:    age 57  Vaping Use   Vaping Use: Never used  Substance and Sexual Activity   Alcohol use:  Yes    Comment: socially   Drug use: No   Sexual activity: Yes    Birth control/protection: Patch  Other Topics Concern   Not on file  Social History Narrative   Not on file   Social Determinants of Health   Financial Resource Strain: Not on file  Food Insecurity: Not on file  Transportation Needs: Not on file  Physical Activity: Not on file  Stress: Not on file  Social Connections: Not on file     Family History: The patient's family history includes Aneurysm in her maternal grandmother; Asthma in her daughter; Heart attack in her maternal grandfather; Hypertension in her father and mother. There is no history of Other.  ROS:   Please see the history of present illness.    All other systems reviewed and are negative.  EKGs/Labs/Other Studies Reviewed:    The following studies were reviewed today: I discussed my findings with the patient at length.  EKG reveals sinus rhythm and nonspecific ST-T changes.  Monitor findings discussed above.   Recent Labs: No results found for requested labs within last 8760 hours.  Recent Lipid Panel No results found for: CHOL, TRIG, HDL, CHOLHDL, VLDL, LDLCALC, LDLDIRECT  Physical Exam:    VS:  BP 126/76   Pulse 88   Ht 5\' 1"  (1.549 m)   Wt 163 lb 12.8 oz (74.3 kg)   SpO2 95%   BMI 30.95 kg/m     Wt Readings from Last 3 Encounters:  03/20/21 163 lb 12.8 oz (74.3 kg)  10/31/18 156 lb (70.8 kg)  08/30/18 158 lb 8 oz (71.9 kg)     GEN: Patient is in no acute distress HEENT: Normal NECK: No JVD; No carotid bruits LYMPHATICS: No lymphadenopathy CARDIAC: S1 S2 regular, 2/6 systolic murmur at the apex. RESPIRATORY:  Clear to auscultation without rales, wheezing or rhonchi  ABDOMEN: Soft, non-tender, non-distended MUSCULOSKELETAL:  No edema; No deformity  SKIN: Warm and dry NEUROLOGIC:  Alert and oriented x 3 PSYCHIATRIC:  Normal affect    Signed, 09/01/18, MD  03/20/2021 11:16 AM    [facility]

## 2021-03-20 NOTE — Addendum Note (Signed)
Addended by: Eleonore Chiquito on: 03/20/2021 11:30 AM   Modules accepted: Orders

## 2021-04-10 ENCOUNTER — Other Ambulatory Visit: Payer: Managed Care, Other (non HMO)

## 2021-04-30 ENCOUNTER — Ambulatory Visit (INDEPENDENT_AMBULATORY_CARE_PROVIDER_SITE_OTHER): Payer: Managed Care, Other (non HMO)

## 2021-04-30 ENCOUNTER — Other Ambulatory Visit: Payer: Self-pay

## 2021-04-30 DIAGNOSIS — R55 Syncope and collapse: Secondary | ICD-10-CM

## 2021-04-30 DIAGNOSIS — I441 Atrioventricular block, second degree: Secondary | ICD-10-CM | POA: Diagnosis not present

## 2021-04-30 LAB — ECHOCARDIOGRAM COMPLETE
Area-P 1/2: 3.23 cm2
S' Lateral: 2.3 cm

## 2021-04-30 NOTE — Progress Notes (Signed)
Complete echocardiogram performed.  Jimmy Eisen Robenson RDCS, RVT  

## 2021-05-04 ENCOUNTER — Other Ambulatory Visit: Payer: Self-pay

## 2021-05-04 ENCOUNTER — Encounter: Payer: Self-pay | Admitting: Cardiology

## 2021-05-04 ENCOUNTER — Ambulatory Visit (INDEPENDENT_AMBULATORY_CARE_PROVIDER_SITE_OTHER): Payer: Managed Care, Other (non HMO) | Admitting: Cardiology

## 2021-05-04 VITALS — BP 112/74 | HR 79 | Ht 61.0 in | Wt 158.0 lb

## 2021-05-04 DIAGNOSIS — R55 Syncope and collapse: Secondary | ICD-10-CM | POA: Diagnosis not present

## 2021-05-04 NOTE — Progress Notes (Signed)
Electrophysiology Office Note   Date:  05/04/2021   ID:  Rachel Finley, DOB [birth year], MRN [id number]  PCP:  Marylen Ponto, MD  Cardiologist:  Revankar Primary Electrophysiologist:  Albino Bufford Jorja Loa, MD    Chief Complaint: syncope   History of Present Illness: Rachel Finley is a 40 y.o. female who is being seen today for the evaluation of syncope at the request of Revankar, Aundra Dubin, MD. Presenting today for electrophysiology evaluation.  She presents today for evaluation of syncope.  She has been having dizzy and fainting spells.  She wore a cardiac monitor that showed nocturnal bradycardia but no evidence of arrhythmia.  She had an episode of near syncope when she was standing.  She had no symptoms while wearing her monitor.  She has no cardiac history.  Each of her episodes of near syncope have occurred with changing position.  She gets a similar symptoms when standing up from getting out of her car.  Since her episode prior to her monitor, she has had no further episodes.  Today, she denies symptoms of palpitations, chest pain, shortness of breath, orthopnea, PND, lower extremity edema, claudication, dizziness, presyncope, syncope, bleeding, or neurologic sequela. The patient is tolerating medications without difficulties.    Past Medical History:  Diagnosis Date   Allergy    Bartholin's gland abscess 02/26/2021   BMI 30.0-30.9,adult    Carpal tunnel syndrome, bilateral    Chronic low back pain 08/30/2018   Dizziness    Flat feet    Generalized anxiety disorder with panic attacks    Headache    Headache(784.0)    History of migraine headaches    Neuropathy    Thoracic back pain    Trichimoniasis    Trichomonal vulvovaginitis 12/03/2014   Urinary tract infection    Past Surgical History:  Procedure Laterality Date   TOOTH EXTRACTION     scheduled for Monday Dec 22nd 2014     Current Outpatient Medications  Medication Sig Dispense Refill   ibuprofen (ADVIL) 800  MG tablet Take 800 mg by mouth as needed for pain.     naproxen (NAPROSYN) 375 MG tablet Take 375 mg by mouth as needed for mild pain.     No current facility-administered medications for this visit.    Allergies:   Latex and Codeine   Social History:  The patient  reports that she has quit smoking. She has never used smokeless tobacco. She reports current alcohol use. She reports that she does not use drugs.   Family History:  The patient's family history includes Aneurysm in her maternal grandmother; Asthma in her daughter; Heart attack in her maternal grandfather; Hypertension in her father and mother.    ROS:  Please see the history of present illness.   Otherwise, review of systems is positive for none.   All other systems are reviewed and negative.    PHYSICAL EXAM: VS:  There were no vitals taken for this visit. , BMI There is no height or weight on file to calculate BMI. GEN: Well nourished, well developed, in no acute distress  HEENT: normal  Neck: no JVD, carotid bruits, or masses Cardiac: RRR; no murmurs, rubs, or gallops,no edema  Respiratory:  clear to auscultation bilaterally, normal work of breathing GI: soft, nontender, nondistended, + BS MS: no deformity or atrophy  Skin: warm and dry Neuro:  Strength and sensation are intact Psych: euthymic mood, full affect  EKG:  EKG is ordered today. Personal review  of the ekg ordered shows sinus rhythm, rate 79  Recent Labs: No results found for requested labs within last 8760 hours.    Lipid Panel  No results found for: CHOL, TRIG, HDL, CHOLHDL, VLDL, LDLCALC, LDLDIRECT   Wt Readings from Last 3 Encounters:  03/20/21 163 lb 12.8 oz (74.3 kg)  10/31/18 156 lb (70.8 kg)  08/30/18 158 lb 8 oz (71.9 kg)      Other studies Reviewed: Additional studies/ records that were reviewed today include: TTE 04/30/21  Review of the above records today demonstrates:   1. Left ventricular ejection fraction, by estimation, is 60  to 65%. The  left ventricle has normal function. The left ventricle has no regional  wall motion abnormalities. Left ventricular diastolic parameters were  normal.   2. Right ventricular systolic function is normal. The right ventricular  size is normal. There is normal pulmonary artery systolic pressure.   3. The mitral valve is normal in structure. No evidence of mitral valve  regurgitation. No evidence of mitral stenosis.   4. The aortic valve is normal in structure. Aortic valve regurgitation is  not visualized. No aortic stenosis is present.   5. The inferior vena cava is normal in size with greater than 50%  respiratory variability, suggesting right atrial pressure of 3 mmHg.  Monitor 02/07/21 personally reviewed Predominant rhythm was sinus rhythm No obvious arrhythmias  ASSESSMENT AND PLAN:  1.  Near syncope: Has nocturnal bradycardia, but no daytime bradycardia.  Echo is without major abnormality.  Echoes appear orthostatic in nature as they have occurred with changes in position.  She has had some bradycardia on her monitor, though this is all nocturnal and unlikely to cause her long-term issues.  I do not feel that she needs further monitoring for these episodes.  I have told her that if she has further episodes of syncope that are not related to changing in position, that Linq monitor may be warranted.    Current medicines are reviewed at length with the patient today.   The patient does not have concerns regarding her medicines.  The following changes were made today:  none  Labs/ tests ordered today include:  No orders of the defined types were placed in this encounter.    Disposition:   FU with Reily Ilic as needed   Signed, Laetitia Schnepf Jorja Loa, MD  05/04/2021 8:07 AM     [facility] [address] [place] [address] [phone] (office) [phone] (fax)

## 2021-06-11 ENCOUNTER — Other Ambulatory Visit: Payer: Self-pay

## 2021-06-11 ENCOUNTER — Ambulatory Visit (INDEPENDENT_AMBULATORY_CARE_PROVIDER_SITE_OTHER): Payer: Managed Care, Other (non HMO) | Admitting: Cardiology

## 2021-06-11 ENCOUNTER — Encounter: Payer: Self-pay | Admitting: Cardiology

## 2021-06-11 VITALS — BP 114/70 | HR 86 | Ht 61.0 in | Wt 159.4 lb

## 2021-06-11 DIAGNOSIS — I441 Atrioventricular block, second degree: Secondary | ICD-10-CM | POA: Diagnosis not present

## 2021-06-11 DIAGNOSIS — R55 Syncope and collapse: Secondary | ICD-10-CM | POA: Diagnosis not present

## 2021-06-11 NOTE — Patient Instructions (Signed)

## 2021-06-11 NOTE — Progress Notes (Signed)
Cardiology Office Note:    Date:  06/11/2021   ID:  DEMITRIA Finley, DOB Aug 06, 1981, MRN [id number]  PCP:  Marylen Ponto, MD  Cardiologist:  Garwin Brothers, MD   Referring MD: Marylen Ponto, MD    ASSESSMENT:    1. Atrioventricular block, Mobitz type 1, Wenckebach   2. Syncope, unspecified syncope type    PLAN:    In order of problems listed above:  Primary prevention stressed with the patient.  Importance of compliance with diet medication stressed and she vocalized understanding. Mobitz type I Wenckebach block: Stable at this time and asymptomatic.  We will continue to monitor.  Patient was advised to keep her self hydrated especially in symptoms of orthostasis.  Her blood pressure is borderline. Patient will be seen in follow-up appointment in 6 months or earlier if the patient has any concerns    Medication Adjustments/Labs and Tests Ordered: Current medicines are reviewed at length with the patient today.  Concerns regarding medicines are outlined above.  No orders of the defined types were placed in this encounter.  No orders of the defined types were placed in this encounter.    No chief complaint on file.    History of Present Illness:    Rachel Finley is a 40 y.o. female.  Patient has past medical history of bradycardia and Mobitz type I Wenckebach AV block especially during night times.  She had an episode of syncope and our electrophysiology colleagues and myself felt this was more of orthostasis.  At the time of my evaluation, the patient is alert awake oriented and in no distress.  Subsequently the patient has had no such issues.  Past Medical History:  Diagnosis Date   Allergy    Atrioventricular block, Mobitz type 1, Wenckebach 03/20/2021   Bartholin's gland abscess 02/26/2021   BMI 30.0-30.9,adult    Carpal tunnel syndrome, bilateral    Chronic low back pain 08/30/2018   Dizziness    Flat feet    Generalized anxiety disorder with panic attacks     Headache    History of migraine headaches    Neuropathy    Syncope 03/20/2021   Thoracic back pain    Trichimoniasis    Trichomonal vulvovaginitis 12/03/2014   Urinary tract infection     Past Surgical History:  Procedure Laterality Date   TOOTH EXTRACTION     scheduled for Monday Dec 22nd 2014    Current Medications: Current Meds  Medication Sig   ibuprofen (ADVIL) 800 MG tablet Take 800 mg by mouth as needed for pain.   naproxen (NAPROSYN) 375 MG tablet Take 375 mg by mouth as needed for mild pain.     Allergies:   Latex and Codeine   Social History   Socioeconomic History   Marital status: Single    Spouse name: Not on file   Number of children: Not on file   Years of education: Not on file   Highest education level: Not on file  Occupational History   Not on file  Tobacco Use   Smoking status: Former   Smokeless tobacco: Never   Tobacco comments:    age 77  Vaping Use   Vaping Use: Never used  Substance and Sexual Activity   Alcohol use: Yes    Comment: socially   Drug use: No   Sexual activity: Yes    Birth control/protection: Patch  Other Topics Concern   Not on file  Social History Narrative  Not on file   Social Determinants of Health   Financial Resource Strain: Not on file  Food Insecurity: Not on file  Transportation Needs: Not on file  Physical Activity: Not on file  Stress: Not on file  Social Connections: Not on file     Family History: The patient's family history includes Aneurysm in her maternal grandmother; Asthma in her daughter; Heart attack in her maternal grandfather; Hypertension in her father and mother. There is no history of Other.  ROS:   Please see the history of present illness.    All other systems reviewed and are negative.  EKGs/Labs/Other Studies Reviewed:    The following studies were reviewed today: I discussed my findings with the patient at length and also discussed findings by electrophysiology  colleagues.   Recent Labs: No results found for requested labs within last 8760 hours.  Recent Lipid Panel No results found for: CHOL, TRIG, HDL, CHOLHDL, VLDL, LDLCALC, LDLDIRECT  Physical Exam:    VS:  BP 114/70   Pulse 86   Ht 5\' 1"  (1.549 m)   Wt 159 lb 6.4 oz (72.3 kg)   SpO2 92%   BMI 30.12 kg/m     Wt Readings from Last 3 Encounters:  06/11/21 159 lb 6.4 oz (72.3 kg)  05/04/21 158 lb (71.7 kg)  03/20/21 163 lb 12.8 oz (74.3 kg)     GEN: Patient is in no acute distress HEENT: Normal NECK: No JVD; No carotid bruits LYMPHATICS: No lymphadenopathy CARDIAC: Hear sounds regular, 2/6 systolic murmur at the apex. RESPIRATORY:  Clear to auscultation without rales, wheezing or rhonchi  ABDOMEN: Soft, non-tender, non-distended MUSCULOSKELETAL:  No edema; No deformity  SKIN: Warm and dry NEUROLOGIC:  Alert and oriented x 3 PSYCHIATRIC:  Normal affect   Signed, 03/22/21, MD  06/11/2021 4:44 PM    [facility]

## 2021-12-14 ENCOUNTER — Ambulatory Visit: Payer: Managed Care, Other (non HMO) | Admitting: Cardiology

## 2021-12-29 ENCOUNTER — Ambulatory Visit (INDEPENDENT_AMBULATORY_CARE_PROVIDER_SITE_OTHER): Payer: Managed Care, Other (non HMO) | Admitting: Cardiology

## 2021-12-29 ENCOUNTER — Encounter: Payer: Self-pay | Admitting: Cardiology

## 2021-12-29 ENCOUNTER — Other Ambulatory Visit: Payer: Self-pay

## 2021-12-29 VITALS — BP 106/62 | HR 76 | Ht 61.0 in | Wt 170.4 lb

## 2021-12-29 DIAGNOSIS — I441 Atrioventricular block, second degree: Secondary | ICD-10-CM | POA: Diagnosis not present

## 2021-12-29 NOTE — Patient Instructions (Signed)

## 2021-12-29 NOTE — Progress Notes (Signed)
?Cardiology Office Note:   ? ?Date:  12/29/2021  ? ?ID:  Rachel Finley, DOB [birth year], MRN [id number] ? ?PCP:  Ronita Hipps, MD  ?Cardiologist:  Jenean Lindau, MD  ? ?Referring MD: Ronita Hipps, MD  ? ? ?ASSESSMENT:   ? ?1. Atrioventricular block, Mobitz type 1, Wenckebach   ? ?PLAN:   ? ?In order of problems listed above: ? ?Primary prevention stressed with the patient.  Importance of compliance with diet medication stressed and she vocalized understanding.  She was advised to walk at least half an hour a day 5 days a week and she promises to do so. ?Mobitz type I Wenckebach: Stable at this time.  Patient is asymptomatic. ?Orthostatic hypotension: The symptoms are very typical and are happening only rarely.  I told her especially with the weather getting warmer she needs to keep herself well-hydrated with extra salt and water in the diet and she understands.  Again she has never had a passing out spell or any such issues. ?Patient will be seen in follow-up appointment in 12 months or earlier if the patient has any concerns ? ? ? ?Medication Adjustments/Labs and Tests Ordered: ?Current medicines are reviewed at length with the patient today.  Concerns regarding medicines are outlined above.  ?No orders of the defined types were placed in this encounter. ? ?No orders of the defined types were placed in this encounter. ? ? ? ?No chief complaint on file. ?  ? ?History of Present Illness:   ? ?Rachel Finley is a 41 y.o. female.  Patient denies any problems at this time and takes care of activities of daily living.  She ultimately gives history of postural hypotension at times.  Her blood pressure is borderline.  She has never had any dizzy spells or syncopal spells.  At the time of my evaluation, the patient is alert awake oriented and in no distress. ? ?Past Medical History:  ?Diagnosis Date  ? Allergy   ? Atrioventricular block, Mobitz type 1, Wenckebach 03/20/2021  ? Bartholin's gland abscess 02/26/2021  ? BMI  30.0-30.9,adult   ? Carpal tunnel syndrome, bilateral   ? Chronic low back pain 08/30/2018  ? Dizziness   ? Flat feet   ? Generalized anxiety disorder with panic attacks   ? Headache   ? History of migraine headaches   ? Neuropathy   ? Syncope 03/20/2021  ? Thoracic back pain   ? Trichimoniasis   ? Trichomonal vulvovaginitis 12/03/2014  ? Urinary tract infection   ? ? ?Past Surgical History:  ?Procedure Laterality Date  ? TOOTH EXTRACTION    ? scheduled for Monday Dec 22nd 2014  ? ? ?Current Medications: ?Current Meds  ?Medication Sig  ? ibuprofen (ADVIL) 800 MG tablet Take 800 mg by mouth as needed for pain.  ?  ? ?Allergies:   Latex and Codeine  ? ?Social History  ? ?Socioeconomic History  ? Marital status: Single  ?  Spouse name: Not on file  ? Number of children: Not on file  ? Years of education: Not on file  ? Highest education level: Not on file  ?Occupational History  ? Not on file  ?Tobacco Use  ? Smoking status: Former  ? Smokeless tobacco: Never  ? Tobacco comments:  ?  age 66  ?Vaping Use  ? Vaping Use: Never used  ?Substance and Sexual Activity  ? Alcohol use: Yes  ?  Comment: socially  ? Drug use: No  ? Sexual  activity: Yes  ?  Birth control/protection: Patch  ?Other Topics Concern  ? Not on file  ?Social History Narrative  ? Not on file  ? ?Social Determinants of Health  ? ?Financial Resource Strain: Not on file  ?Food Insecurity: Not on file  ?Transportation Needs: Not on file  ?Physical Activity: Not on file  ?Stress: Not on file  ?Social Connections: Not on file  ?  ? ?Family History: ?The patient's family history includes Aneurysm in her maternal grandmother; Asthma in her daughter; Heart attack in her maternal grandfather; Hypertension in her father and mother. There is no history of Other. ? ?ROS:   ?Please see the history of present illness.    ?All other systems reviewed and are negative. ? ?EKGs/Labs/Other Studies Reviewed:   ? ?The following studies were reviewed today: ?I discussed my  findings with the patient at length ? ? ?Recent Labs: ?No results found for requested labs within last 8760 hours.  ?Recent Lipid Panel ?No results found for: CHOL, TRIG, HDL, CHOLHDL, VLDL, LDLCALC, LDLDIRECT ? ?Physical Exam:   ? ?VS:  BP 106/62   Pulse 76   Ht 5\' 1"  (1.549 m)   Wt 170 lb 6.4 oz (77.3 kg)   BMI 32.20 kg/m?    ? ?Wt Readings from Last 3 Encounters:  ?12/29/21 170 lb 6.4 oz (77.3 kg)  ?06/11/21 159 lb 6.4 oz (72.3 kg)  ?05/04/21 158 lb (71.7 kg)  ?  ? ?GEN: Patient is in no acute distress ?HEENT: Normal ?NECK: No JVD; No carotid bruits ?LYMPHATICS: No lymphadenopathy ?CARDIAC: Hear sounds regular, 2/6 systolic murmur at the apex. ?RESPIRATORY:  Clear to auscultation without rales, wheezing or rhonchi  ?ABDOMEN: Soft, non-tender, non-distended ?MUSCULOSKELETAL:  No edema; No deformity  ?SKIN: Warm and dry ?NEUROLOGIC:  Alert and oriented x 3 ?PSYCHIATRIC:  Normal affect  ? ?Signed, ?Jenean Lindau, MD  ?12/29/2021 2:18 PM    ?[place]  ?

## 2022-04-07 DIAGNOSIS — Z3042 Encounter for surveillance of injectable contraceptive: Secondary | ICD-10-CM | POA: Diagnosis not present

## 2022-04-19 DIAGNOSIS — Z6832 Body mass index (BMI) 32.0-32.9, adult: Secondary | ICD-10-CM | POA: Diagnosis not present

## 2022-04-19 DIAGNOSIS — R200 Anesthesia of skin: Secondary | ICD-10-CM | POA: Diagnosis not present

## 2022-04-23 ENCOUNTER — Encounter: Payer: Self-pay | Admitting: Neurology

## 2022-05-04 DIAGNOSIS — F410 Panic disorder [episodic paroxysmal anxiety] without agoraphobia: Secondary | ICD-10-CM | POA: Diagnosis not present

## 2022-05-04 DIAGNOSIS — Z6832 Body mass index (BMI) 32.0-32.9, adult: Secondary | ICD-10-CM | POA: Diagnosis not present

## 2022-05-04 DIAGNOSIS — F411 Generalized anxiety disorder: Secondary | ICD-10-CM | POA: Diagnosis not present

## 2022-05-25 DIAGNOSIS — F410 Panic disorder [episodic paroxysmal anxiety] without agoraphobia: Secondary | ICD-10-CM | POA: Diagnosis not present

## 2022-05-25 DIAGNOSIS — Z6832 Body mass index (BMI) 32.0-32.9, adult: Secondary | ICD-10-CM | POA: Diagnosis not present

## 2022-05-25 DIAGNOSIS — F411 Generalized anxiety disorder: Secondary | ICD-10-CM | POA: Diagnosis not present

## 2022-06-30 DIAGNOSIS — Z01419 Encounter for gynecological examination (general) (routine) without abnormal findings: Secondary | ICD-10-CM | POA: Diagnosis not present

## 2022-06-30 DIAGNOSIS — Z1231 Encounter for screening mammogram for malignant neoplasm of breast: Secondary | ICD-10-CM | POA: Diagnosis not present

## 2022-06-30 DIAGNOSIS — Z124 Encounter for screening for malignant neoplasm of cervix: Secondary | ICD-10-CM | POA: Diagnosis not present
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
## 2022-07-06 ENCOUNTER — Encounter: Payer: Self-pay | Admitting: Neurology

## 2022-07-06 ENCOUNTER — Ambulatory Visit: Payer: BC Managed Care – PPO | Admitting: Neurology

## 2023-03-22 DIAGNOSIS — A749 Chlamydial infection, unspecified: Secondary | ICD-10-CM

## 2023-03-22 DIAGNOSIS — N939 Abnormal uterine and vaginal bleeding, unspecified: Secondary | ICD-10-CM

## 2023-03-22 DIAGNOSIS — R319 Hematuria, unspecified: Secondary | ICD-10-CM

## 2023-03-22 DIAGNOSIS — N898 Other specified noninflammatory disorders of vagina: Secondary | ICD-10-CM | POA: Insufficient documentation

## 2023-03-22 DIAGNOSIS — B3731 Acute candidiasis of vulva and vagina: Secondary | ICD-10-CM | POA: Insufficient documentation

## 2023-03-22 DIAGNOSIS — B9689 Other specified bacterial agents as the cause of diseases classified elsewhere: Secondary | ICD-10-CM

## 2023-03-22 DIAGNOSIS — R399 Unspecified symptoms and signs involving the genitourinary system: Secondary | ICD-10-CM

## 2023-03-22 DIAGNOSIS — N760 Acute vaginitis: Secondary | ICD-10-CM | POA: Insufficient documentation

## 2023-03-22 HISTORY — DX: Unspecified symptoms and signs involving the genitourinary system: R39.9

## 2023-03-22 HISTORY — DX: Hematuria, unspecified: R31.9

## 2023-03-22 HISTORY — DX: Other specified noninflammatory disorders of vagina: N89.8

## 2023-03-22 HISTORY — DX: Abnormal uterine and vaginal bleeding, unspecified: N93.9

## 2023-03-22 HISTORY — DX: Acute candidiasis of vulva and vagina: B37.31

## 2023-03-22 HISTORY — DX: Chlamydial infection, unspecified: A74.9

## 2023-03-22 HISTORY — DX: Other specified bacterial agents as the cause of diseases classified elsewhere: B96.89

## 2023-04-11 ENCOUNTER — Ambulatory Visit: Payer: Medicaid Other | Admitting: Cardiology

## 2023-04-11 ENCOUNTER — Encounter: Payer: Self-pay | Admitting: Cardiology

## 2023-04-11 ENCOUNTER — Ambulatory Visit: Payer: Managed Care, Other (non HMO) | Attending: Cardiology | Admitting: Cardiology

## 2023-04-11 VITALS — BP 110/64 | HR 73 | Ht 61.0 in | Wt 168.4 lb

## 2023-04-11 DIAGNOSIS — R55 Syncope and collapse: Secondary | ICD-10-CM | POA: Diagnosis not present

## 2023-04-11 NOTE — Patient Instructions (Signed)
Medication Instructions:  Your physician recommends that you continue on your current medications as directed. Please refer to the Current Medication list given to you today.  *If you need a refill on your cardiac medications before your next appointment, please call your pharmacy*   Lab Work: None ordered If you have labs (blood work) drawn today and your tests are completely normal, you will receive your results only by: MyChart Message (if you have MyChart) OR A paper copy in the mail If you have any lab test that is abnormal or we need to change your treatment, we will call you to review the results.   Testing/Procedures: None ordered   Follow-Up: At [facility], you and your health needs are our priority.  As part of our continuing mission to provide you with exceptional heart care, we have created designated Provider Care Teams.  These Care Teams include your primary Cardiologist (physician) and Advanced Practice Providers (APPs -  Physician Assistants and Nurse Practitioners) who all work together to provide you with the care you need, when you need it.  We recommend signing up for the patient portal called "MyChart".  Sign up information is provided on this After Visit Summary.  MyChart is used to connect with patients for Virtual Visits (Telemedicine).  Patients are able to view lab/test results, encounter notes, upcoming appointments, etc.  Non-urgent messages can be sent to your provider as well.   To learn more about what you can do with MyChart, go to [email or web address].    Your next appointment:   12 month(s)  The format for your next appointment:   In Person  Provider:   Rajan Revankar, MD    Other Instructions none  Important Information About Sugar      

## 2023-04-11 NOTE — Progress Notes (Signed)
Cardiology Office Note:    Date:  04/11/2023   ID:  SHAWNIECE GEWIRTZ, DOB [birth year], MRN [id number]  PCP:  Marylen Ponto, MD  Cardiologist:  Garwin Brothers, MD   Referring MD: Marylen Ponto, MD    ASSESSMENT:    1. Syncope, unspecified syncope type    PLAN:    In order of problems listed above:  Primary prevention stressed with the patient.  Importance of compliance with diet medication stressed and patient verbalized standing. Syncope: This has resolved.  It appears to have been a result of dehydration.  This happened almost several months ago.  She is happy it has not recurred Blood pressure is stable Obesity: Weight reduction stressed diet emphasized and she promises to do better. Patient will be seen in follow-up appointment in 6 months or earlier if the patient has any concerns.    Medication Adjustments/Labs and Tests Ordered: Current medicines are reviewed at length with the patient today.  Concerns regarding medicines are outlined above.  Orders Placed This Encounter  Procedures   EKG 12-Lead   No orders of the defined types were placed in this encounter.    No chief complaint on file.    History of Present Illness:    GERICA Rachel Finley is a 42 y.o. female.  Patient was evaluated in the past for syncope.  It appears that the patient had been dehydrated at that time.  Since then she has had no issues.  No chest pain orthopnea or PND.  She has been diagnosed to have diet-controlled diabetes mellitus and she is obese.  She leads a sedentary lifestyle.  At the time of my evaluation, the patient is alert awake oriented and in no distress.  Past Medical History:  Diagnosis Date   Abnormal vaginal bleeding 03/22/2023   Allergy    Atrioventricular block, Mobitz type 1, Wenckebach 03/20/2021   Bacterial vaginosis 03/22/2023   Bartholin's gland abscess 02/26/2021   Blood in urine 03/22/2023   BMI 30.0-30.9,adult    Candidiasis of vagina 03/22/2023   Carpal tunnel  syndrome, bilateral    Chlamydial infection 03/22/2023   Chronic low back pain 08/30/2018   Dizziness    Flat feet    Generalized anxiety disorder with panic attacks    Headache    History of migraine headaches    Neuropathy    Symptoms involving urinary system 03/22/2023   Syncope 03/20/2021   Thoracic back pain    Trichimoniasis    Trichomonal vulvovaginitis 12/03/2014   Urinary tract infection    Vaginal discharge 03/22/2023   Vaginal odor 03/22/2023    Past Surgical History:  Procedure Laterality Date   TOOTH EXTRACTION     scheduled for Monday Dec 22nd 2014    Current Medications: Current Meds  Medication Sig   ibuprofen (ADVIL) 800 MG tablet Take 800 mg by mouth as needed for pain.   medroxyPROGESTERone Acetate 150 MG/ML SUSY Inject 1 mL into the muscle every 3 (three) months.     Allergies:   Latex and Codeine   Social History   Socioeconomic History   Marital status: Single    Spouse name: Not on file   Number of children: Not on file   Years of education: Not on file   Highest education level: Not on file  Occupational History   Not on file  Tobacco Use   Smoking status: Former   Smokeless tobacco: Never   Tobacco comments:    age 72  Vaping  Use   Vaping Use: Never used  Substance and Sexual Activity   Alcohol use: Yes    Comment: socially   Drug use: No   Sexual activity: Yes    Birth control/protection: Patch  Other Topics Concern   Not on file  Social History Narrative   Not on file   Social Determinants of Health   Financial Resource Strain: Not on file  Food Insecurity: Not on file  Transportation Needs: Not on file  Physical Activity: Not on file  Stress: Not on file  Social Connections: Not on file     Family History: The patient's family history includes Aneurysm in her maternal grandmother; Asthma in her daughter; Heart attack in her maternal grandfather; Hypertension in her father and mother. There is no history of  Other.  ROS:   Please see the history of present illness.    All other systems reviewed and are negative.  EKGs/Labs/Other Studies Reviewed:    The following studies were reviewed today: EKG was sinus rhythm and nonspecific ST-T changes   Recent Labs: No results found for requested labs within last 365 days.  Recent Lipid Panel No results found for: "CHOL", "TRIG", "HDL", "CHOLHDL", "VLDL", "LDLCALC", "LDLDIRECT"  Physical Exam:    VS:  BP 110/64   Pulse 73   Ht 5\' 1"  (1.549 m)   Wt 168 lb 6.4 oz (76.4 kg)   SpO2 94%   BMI 31.82 kg/m     Wt Readings from Last 3 Encounters:  04/11/23 168 lb 6.4 oz (76.4 kg)  12/29/21 170 lb 6.4 oz (77.3 kg)  06/11/21 159 lb 6.4 oz (72.3 kg)     GEN: Patient is in no acute distress HEENT: Normal NECK: No JVD; No carotid bruits LYMPHATICS: No lymphadenopathy CARDIAC: Hear sounds regular, 2/6 systolic murmur at the apex. RESPIRATORY:  Clear to auscultation without rales, wheezing or rhonchi  ABDOMEN: Soft, non-tender, non-distended MUSCULOSKELETAL:  No edema; No deformity  SKIN: Warm and dry NEUROLOGIC:  Alert and oriented x 3 PSYCHIATRIC:  Normal affect   Signed, Garwin Brothers, MD  04/11/2023 3:36 PM    [facility]

## 2023-06-08 ENCOUNTER — Ambulatory Visit: Payer: Medicaid Other | Admitting: Cardiology

## 2024-10-22 ENCOUNTER — Encounter: Payer: Self-pay | Admitting: Cardiology

## 2024-10-22 ENCOUNTER — Ambulatory Visit: Attending: Cardiology | Admitting: Cardiology

## 2024-10-22 VITALS — BP 114/80 | HR 78 | Ht 61.0 in | Wt 167.0 lb

## 2024-10-22 DIAGNOSIS — R55 Syncope and collapse: Secondary | ICD-10-CM

## 2024-10-22 NOTE — Progress Notes (Signed)
 " Cardiology Office Note:    Date:  10/22/2024   ID:  [name], DOB [birth year], MRN [id number]  PCP:  Ina Marcellus RAMAN, MD  Cardiologist:  Jennifer JONELLE Crape, MD   Referring MD: Ina Marcellus RAMAN, MD    ASSESSMENT:    1. Syncope, unspecified syncope type    PLAN:    In order of problems listed above:  Primary prevention stressed with the patient.  Importance of compliance with diet medication stressed and patient verbalized standing. She was advised to walk at least half an hour a day on a daily basis.  Weight reduction was stressed. Syncope: This has never occurred and she is happy about it.  She is keeping herself well-hydrated. Obesity: Weight reduction stressed and she promises to do better.  Diet emphasized. She will be seen in follow-up appointment on a as needed basis only.   Medication Adjustments/Labs and Tests Ordered: Current medicines are reviewed at length with the patient today.  Concerns regarding medicines are outlined above.  Orders Placed This Encounter  Procedures   EKG 12-Lead   No orders of the defined types were placed in this encounter.    Chief Complaint  Patient presents with   Annual Exam     History of Present Illness:    Rachel Finley is a 44 y.o. female.  Patient has past medical history of syncope.  Overall she is a healthy lady.  She denies any chest pain orthopnea or PND.  At the time of my evaluation, the patient is alert awake oriented and in no distress.  She is now happy that she has not had any recurrences.  She tells me that her job description has changed and she is under much less stress.  At the time of my evaluation, the patient is alert awake oriented and in no distress.  Past Medical History:  Diagnosis Date   Abnormal vaginal bleeding 03/22/2023   Allergy    Atrioventricular block, Mobitz type 1, Wenckebach 03/20/2021   Bacterial vaginosis 03/22/2023   Bartholin's gland abscess 02/26/2021   Blood in urine 03/22/2023   BMI  30.0-30.9,adult    Candidiasis of vagina 03/22/2023   Carpal tunnel syndrome, bilateral    Chlamydial infection 03/22/2023   Chronic low back pain 08/30/2018   Dizziness    Flat feet    Generalized anxiety disorder with panic attacks    Headache    History of migraine headaches    Neuropathy    Symptoms involving urinary system 03/22/2023   Syncope 03/20/2021   Thoracic back pain    Trichomonal vulvovaginitis 12/03/2014   Trichomoniasis    IMO SNOMED Dx Update Oct 2024     Urinary tract infection    Vaginal discharge 03/22/2023   Vaginal odor 03/22/2023    Past Surgical History:  Procedure Laterality Date   TOOTH EXTRACTION     scheduled for Monday Dec 22nd 2014    Current Medications: Active Medications[1]   Allergies:   Latex and Codeine   Social History   Socioeconomic History   Marital status: Single    Spouse name: Not on file   Number of children: Not on file   Years of education: Not on file   Highest education level: Not on file  Occupational History   Not on file  Tobacco Use   Smoking status: Former   Smokeless tobacco: Never   Tobacco comments:    age 14  Vaping Use   Vaping status: Never Used  Substance and Sexual Activity   Alcohol use: Yes    Comment: socially   Drug use: No   Sexual activity: Yes    Birth control/protection: Patch  Other Topics Concern   Not on file  Social History Narrative   Not on file   Social Drivers of Health   Tobacco Use: Medium Risk (10/22/2024)   Patient History    Smoking Tobacco Use: Former    Smokeless Tobacco Use: Never    Passive Exposure: Not on Actuary Strain: Not on file  Food Insecurity: Low Risk (06/05/2024)   Received from [facility]   Epic    Within the past 12 months, you worried that your food would run out before you got money to buy more: Never true    Within the past 12 months, the food you bought just didn't last and you didn't have money to get more. : Never true   Transportation Needs: No Transportation Needs (06/05/2024)   Received from [facility]    In the past 12 months, has lack of reliable transportation kept you from medical appointments, meetings, work or from getting things needed for daily living? : No  Physical Activity: Not on file  Stress: Not on file  Social Connections: Not on file  Depression (EYV7-0): Not on file  Alcohol Screen: Not on file  Housing: Low Risk (06/05/2024)   Received from [facility]   Epic    What is your living situation today?: I have a steady place to live    Think about the place you live. Do you have problems with any of the following? Choose all that apply:: None/None on this list  Utilities: Low Risk (06/05/2024)   Received from [facility]   Utilities    In the past 12 months has the electric, gas, oil, or water company threatened to shut off services in your home? : No  Health Literacy: Not on file     Family History: The patient's family history includes Aneurysm in her maternal grandmother; Asthma in her daughter; Heart attack in her maternal grandfather; Hypertension in her father and mother. There is no history of Other.  ROS:   Please see the history of present illness.    All other systems reviewed and are negative.  EKGs/Labs/Other Studies Reviewed:    The following studies were reviewed today: .SABRAEKG Interpretation Date/Time:  Monday October 22 2024 13:02:02 EST Ventricular Rate:  78 PR Interval:  144 QRS Duration:  70 QT Interval:  380 QTC Calculation: 433 R Axis:   47  Text Interpretation: Normal sinus rhythm Normal ECG No previous ECGs available Confirmed by Edwyna Backers [phone]) on 10/22/2024 1:18:59 PM     Recent Labs: No results found for requested labs within last 365 days.  Recent Lipid Panel No results found for: CHOL, TRIG, HDL, CHOLHDL, VLDL, LDLCALC, LDLDIRECT  Physical Exam:    VS:  BP 114/80   Pulse 78   Ht 5' 1 (1.549 m)    Wt 167 lb (75.8 kg)   SpO2 97%   BMI 31.55 kg/m     Wt Readings from Last 3 Encounters:  10/22/24 167 lb (75.8 kg)  04/11/23 168 lb 6.4 oz (76.4 kg)  12/29/21 170 lb 6.4 oz (77.3 kg)     GEN: Patient is in no acute distress HEENT: Normal NECK: No JVD; No carotid bruits LYMPHATICS: No lymphadenopathy CARDIAC: Hear sounds regular, 2/6 systolic murmur at the apex. RESPIRATORY:  Clear to auscultation without rales, wheezing or rhonchi  ABDOMEN: Soft, non-tender, non-distended MUSCULOSKELETAL:  No edema; No deformity  SKIN: Warm and dry NEUROLOGIC:  Alert and oriented x 3 PSYCHIATRIC:  Normal affect   Signed, Jennifer JONELLE Crape, MD  10/22/2024 1:19 PM    [facility]     [1]  Current Meds  Medication Sig   albuterol (VENTOLIN HFA) 108 (90 Base) MCG/ACT inhaler Inhale 1-2 puffs into the lungs every 6 (six) hours as needed for wheezing or shortness of breath.   hydrochlorothiazide (HYDRODIURIL) 12.5 MG tablet Take 12.5 mg by mouth daily.   ibuprofen  (ADVIL ) 800 MG tablet Take 800 mg by mouth as needed for pain.   pantoprazole (PROTONIX) 20 MG tablet Take 20 mg by mouth as needed for heartburn or indigestion.   "

## 2024-10-22 NOTE — Progress Notes (Signed)
 Medication Instructions:  Your physician recommends that you continue on your current medications as directed. Please refer to the Current Medication list given to you today.  *If you need a refill on your cardiac medications before your next appointment, please call your pharmacy*   Lab Work: None ordered If you have labs (blood work) drawn today and your tests are completely normal, you will receive your results only by: MyChart Message (if you have MyChart) OR A paper copy in the mail If you have any lab test that is abnormal or we need to change your treatment, we will call you to review the results.   Testing/Procedures: None ordered   Follow-Up: At [facility], you and your health needs are our priority.  As part of our continuing mission to provide you with exceptional heart care, we have created designated Provider Care Teams.  These Care Teams include your primary Cardiologist (physician) and Advanced Practice Providers (APPs -  Physician Assistants and Nurse Practitioners) who all work together to provide you with the care you need, when you need it.  We recommend signing up for the patient portal called MyChart.  Sign up information is provided on this After Visit Summary.  MyChart is used to connect with patients for Virtual Visits (Telemedicine).  Patients are able to view lab/test results, encounter notes, upcoming appointments, etc.  Non-urgent messages can be sent to your provider as well.   To learn more about what you can do with MyChart, go to [email or web address].    Your next appointment:   as needed  The format for your next appointment:   In Person  Provider:   Jennifer Crape, MD    Other Instructions none  Important Information About Sugar

## 2024-10-22 NOTE — Patient Instructions (Addendum)
 Medication Instructions:  Your physician recommends that you continue on your current medications as directed. Please refer to the Current Medication list given to you today.  *If you need a refill on your cardiac medications before your next appointment, please call your pharmacy*   Lab Work: None ordered If you have labs (blood work) drawn today and your tests are completely normal, you will receive your results only by: MyChart Message (if you have MyChart) OR A paper copy in the mail If you have any lab test that is abnormal or we need to change your treatment, we will call you to review the results.   Testing/Procedures: None ordered   Follow-Up: At [facility], you and your health needs are our priority.  As part of our continuing mission to provide you with exceptional heart care, we have created designated Provider Care Teams.  These Care Teams include your primary Cardiologist (physician) and Advanced Practice Providers (APPs -  Physician Assistants and Nurse Practitioners) who all work together to provide you with the care you need, when you need it.  We recommend signing up for the patient portal called MyChart.  Sign up information is provided on this After Visit Summary.  MyChart is used to connect with patients for Virtual Visits (Telemedicine).  Patients are able to view lab/test results, encounter notes, upcoming appointments, etc.  Non-urgent messages can be sent to your provider as well.   To learn more about what you can do with MyChart, go to [email or web address].    Your next appointment:   As needed   The format for your next appointment:   In Person  Provider:   Jennifer Crape, MD    Other Instructions none  Important Information About Sugar
